# Patient Record
Sex: Male | Born: 1945 | Race: White | Hispanic: No | Marital: Married | Smoking: Former smoker
Health system: Southern US, Community
[De-identification: ages and names within clinical notes are randomized; demographics above are authoritative.]

## PROBLEM LIST (undated history)

## (undated) DIAGNOSIS — I872 Venous insufficiency (chronic) (peripheral): Secondary | ICD-10-CM

## (undated) DIAGNOSIS — M1990 Unspecified osteoarthritis, unspecified site: Secondary | ICD-10-CM

## (undated) DIAGNOSIS — J449 Chronic obstructive pulmonary disease, unspecified: Secondary | ICD-10-CM

## (undated) DIAGNOSIS — M797 Fibromyalgia: Secondary | ICD-10-CM

## (undated) DIAGNOSIS — E119 Type 2 diabetes mellitus without complications: Secondary | ICD-10-CM

## (undated) HISTORY — PX: HEMORROIDECTOMY: SUR656

## (undated) HISTORY — PX: LEG SURGERY: SHX1003

---

## 1997-12-12 ENCOUNTER — Ambulatory Visit (HOSPITAL_BASED_OUTPATIENT_CLINIC_OR_DEPARTMENT_OTHER): Admission: RE | Admit: 1997-12-12 | Discharge: 1997-12-12 | Payer: Self-pay | Admitting: General Surgery

## 2002-09-20 ENCOUNTER — Encounter: Payer: Self-pay | Admitting: Internal Medicine

## 2002-09-20 ENCOUNTER — Encounter: Admission: RE | Admit: 2002-09-20 | Discharge: 2002-09-20 | Payer: Self-pay | Admitting: Internal Medicine

## 2003-03-12 ENCOUNTER — Encounter
Admission: RE | Admit: 2003-03-12 | Discharge: 2003-06-10 | Payer: Self-pay | Admitting: Physical Medicine & Rehabilitation

## 2003-08-14 ENCOUNTER — Encounter
Admission: RE | Admit: 2003-08-14 | Discharge: 2003-11-12 | Payer: Self-pay | Admitting: Physical Medicine & Rehabilitation

## 2004-02-03 ENCOUNTER — Encounter
Admission: RE | Admit: 2004-02-03 | Discharge: 2004-05-03 | Payer: Self-pay | Admitting: Physical Medicine & Rehabilitation

## 2004-02-03 ENCOUNTER — Ambulatory Visit: Payer: Self-pay | Admitting: Physical Medicine & Rehabilitation

## 2010-07-24 NOTE — Group Therapy Note (Signed)
PHYSICIAN REFERRAL:  [name], [facility].   PURPOSE OF EVALUATION:  Evaluate and treat chronic fibromyalgia-type pain.   HISTORY OF PRESENT ILLNESS:  [name] is a 65 year old adult male referred  to this office by [name] for evaluation of chronic fibromyalgia-type  pain.   The patient's medical records indicate that he carries a diagnosis of  fibromyalgia since approximately September of 1997 when it was made by Dr.  Lemmie Evens at that time.  Minimal medical records accompany the  patient but they do indicate that he has been on Vicodin dating back to  approximately January of 1998.  The patient subsequently started Norco  10/325 mg approximately June of 2000.   Subsequent to that, the patient has been treated with OxyContin at 20 mg  q.12 h. since August of 2003.  His dose has recently been adjusted up to  OxyContin 20 mg q.3 h. since approximately June of 2004.   The patient reports that he has seen [name] and his primary care  physician, [name], over the past several months to years for the  fibromyalgia-type pain.  He reports that approximately June of 2004, the  pain localized more into his right ankle region.  At that time, there was no  particular trauma noted.  The patient was sent back to [name] and  reportedly x-rays showed no particular abnormality of his ankle.  He  subsequently has undergone injections at least 2 or 3 times of his ankle and  heel for bone spur and those have helped minimally.  He also reports that  the pain prior to his right ankle was localized throughout his upper back  and upper extremities, more similar to fibromyalgia-type pain.   The patient reports that last Thursday he did follow up with [name], his  primary care physician.  Apparently at that time, [name] was  uncomfortable with the amount of OxyContin that was being prescribed.  At  that time they decided to switch to a  Duragesic patch at 100 mcg per hour,  changed every third day.  He also was given a prescription for Talwin at  that time.   Subsequent to last Thursday, the patient reports that he began experiencing  dystonic reaction and was seen in the emergency room.  At that time, he  discontinued his Talwin but continue his Duragesic patch.  He followed up  with [name] this past Monday, the 3rd of January 2005, and was given a  prescription for OxyContin 20 mg.  The patient reports that he has used all  but 5 of those tablets at the present time.   In terms of his relief, he report that he was getting better relief with the  oxycodone at 15 mg to be used 2 tablets q.4 h., a maximum of 8 tablets per  day.  Dr.  [name] had been treating him more recently with OxyContin and he  had been up to a total of 8 tablets a day; he used approximately every 3  hours, a strength of 20 mg.   The patient presently reports the pain is present over his entire body,  although it is worse sometimes compared to others.  The patient reports that  he has been tried on various over-the-counter medications along with Bextra,  Celebrex, Vioxx, Ultram and Vicodin in the past.  He does feel that the  Duragesic patch may be giving him some extra benefit, although he  is  somewhat interested in continuing the OxyContin at 20 mg q.3 h.  I told him  that I would not be able to write for the frequency that he is requesting  that medication.   PAST MEDICAL HISTORY:  1. 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  2. History of sinus infection.  3. History of hypertension.   MEDICATIONS:  1. Amitriptyline 10 mg nightly.  2. Cardura 8 mg daily.  3. Cimetidine 400 mg 4 times daily.  4. Duragesic patch 100 mcg changed every third day.  5. OxyContin 20 mg 1 tablet q.3 h. p.r.n.  6. Flonase 50 mcg daily.  7. Cyclobenzaprine p.r.n.  8. Acyclovir 400 mg b.i.d.   ALLERGIES:  TALWIN.   FAMILY  HISTORY:  Positive for osteoarthritis and rheumatoid arthritis.   SOCIAL HISTORY:  The patient is married with 1 grown child and 3  stepchildren.  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  He reports minimal alcohol intake and reports that he  smokes approximately 1 pack of cigarettes every 4 days.  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  He reports that  he has been unable to do that more recently with the increased right foot  pain.   PHYSICAL EXAM:  GENERAL:  A well-appearing adult male, height 5 foot 11  inches, weight 200 pounds.  VITAL SIGNS:  Blood pressure 147/70 with a pulse of 88 and O2 saturation of  97% on room air.  NEUROMUSCULAR:  The patient uses no assistive device during ambulation.  He  has regular footwear on the bilateral feet.  Bilateral upper extremity exam  showed painful range of motion throughout.  Lumbar range of motion was  mildly to moderately decreased in all planes, although he was able to obtain  the positions.  Manual muscle testing revealed at least 5-/5 strength  throughout the bilateral upper extremities.  The patient complained of pain  with pressure applied to his musculature of his upper extremities.  Range of  motion was full, tested passively in the seated position, of his bilateral  upper extremities.   Lower extremity exam showed at least 5- to 5/5 strength in hip flexion, knee  extension and ankle dorsiflexion throughout the bilateral lower extremities.  Again, there were complaints of pain with palpation of his hip, thigh and  calf musculature.   Reflexes were 2+ and symmetrical throughout the bilateral upper and lower  extremities and sensation was intact to light touch throughout.   In the prone position, there were no appreciable spasms noted.  Hip range of motion was within normal limits in  the supine position with straight leg  raise negative.   IMPRESSION:  Longstanding history of fibromyalgia, narcotic dependent.   At the present time, we have decided to continue the Duragesic patch at 100  mcg per hour, changed every 72 hours.  I have given him a prescription for  10 in the office today.  We have also decided on OxyContin continuous  release 20 mg 1 tablet q.6 h. p.r.n., a total of 240 were written for in the  office today.  I have told him that I am reluctant to prescribe the  OxyContin that he was using and at the frequency that he was using  previously.  Hopefully, we can obtain better relief with the  long-acting  medicine such as the Duragesic patch.   We will plan on seeing the patient in followup in this office in  approximately 1 month's time and hopefully get him on a regular schedule  that we can decrease the office visits.   I will plan on seeing the patient in followup in approximately 1 month's  time.     Ellwood Dense, M.D.   DC/MedQ  D:  03/14/2003 12:38:47  T:  03/14/2003 13:43:42  Job #:  [id number]   cc:   Windle Guard, M.D.  [address]  [place], [place] [address]  Fax: [phone]

## 2010-07-24 NOTE — Assessment & Plan Note (Signed)
FOLLOW UP:  Mr. Go returns to the clinic today for follow-up evaluation.  He reports that he is getting good relief with his methadone use 10 mg four  tablets p.o. t.i.d.  We recently gave him a refill on January 23, 2004.  He  does report that the methadone gives him minimal relief of new onset of  right ankle and foot pain which has been present for the past seven to eight  weeks.  He reports that he has had a diagnosis of plantar fasciitis in the  past and has had injections by his primary care physician.  He is interested  in possibly going back to see his primary care physician.   The patient reports that he is trying orthotics in his right shoe but they  are giving him minimal benefit at the present time.  The patient reports  that he is not using Flexeril very often at the present time.   MEDICATIONS:  1.  Flonase p.r.n.  2.  Acyclovir 400 mg q.12h.  3.  Cardura 8 mg q.d.  4.  Cimetidine 300 mg q.i.d.  5.  Methadone 10 mg 4 tablets q.i.d.  6.  Cyclobenzaprine 10 mg 1/2 tablet q.h.s. p.r.n. (rarely use).   PHYSICAL EXAMINATION:  GENERAL:  A well-appearing, fit adult male in no  acute distress.  VITAL SIGNS:  Blood pressure 126/61 with a pulse of 70, respiratory rate 20.  O2 saturation 95% on room air.  MUSCULOSKELETAL:  He has 5/5 strength throughout the bilateral upper and  lower extremities.  Bulk and tone were normal.  Reflexes were 2+ and  symmetrical.  The patient ambulates without any assisted device.  The  patient has complaints of pain with palpation of musculature of his thighs,  lower arms, and especially upper arms and back.   IMPRESSION:  Fibromyalgia, narcotic dependent.   PLAN:  In the clinic today, no refill of medication is necessary.  I have  asked him to follow up with his primary care physician to see about  a  possible treatment of the assumed plantar fascitis.  He has been trying  orthotics without much relief at the present time.  We will plan on  seeing  the patient in follow up in this office in approximately three to four  months time with refills prior to that appointment if necessary.  He  continues to be very compliant with his methadone at the present time.       DC/MedQ  D:  02/03/2004 13:03:05  T:  02/03/2004 14:19:13  Job #:  [id number]

## 2015-01-20 ENCOUNTER — Telehealth (HOSPITAL_COMMUNITY): Payer: Self-pay

## 2015-01-20 NOTE — Telephone Encounter (Signed)
I have called an verified pulmonary rehab orientation appointment with patient. Instructions given. Patient verbalized understanding. Homework to be mailed.

## 2015-02-03 ENCOUNTER — Ambulatory Visit (HOSPITAL_COMMUNITY): Payer: Self-pay

## 2015-02-07 ENCOUNTER — Encounter (HOSPITAL_COMMUNITY)
Admission: RE | Admit: 2015-02-07 | Discharge: 2015-02-07 | Disposition: A | Discharge status: Home / Self Care | Payer: Medicare Other | Source: Ambulatory Visit | Attending: Internal Medicine | Admitting: Internal Medicine

## 2015-02-07 ENCOUNTER — Encounter (HOSPITAL_COMMUNITY): Payer: Self-pay

## 2015-02-07 VITALS — BP 159/79 | HR 104 | Resp 18 | Ht 67.75 in | Wt 242.3 lb

## 2015-02-07 DIAGNOSIS — J439 Emphysema, unspecified: Secondary | ICD-10-CM | POA: Insufficient documentation

## 2015-02-07 DIAGNOSIS — J441 Chronic obstructive pulmonary disease with (acute) exacerbation: Secondary | ICD-10-CM

## 2015-02-07 HISTORY — DX: Chronic obstructive pulmonary disease, unspecified: J44.9

## 2015-02-07 HISTORY — DX: Venous insufficiency (chronic) (peripheral): I87.2

## 2015-02-07 HISTORY — DX: Fibromyalgia: M79.7

## 2015-02-07 HISTORY — DX: Type 2 diabetes mellitus without complications: E11.9

## 2015-02-07 HISTORY — DX: Unspecified osteoarthritis, unspecified site: M19.90

## 2015-02-07 NOTE — Progress Notes (Signed)
Mark Wood 69 y.o. male Pulmonary Rehab Orientation Note Patient arrived today in Cardiac and Pulmonary Rehab for orientation to Pulmonary Rehab. He ambulated from [employer] accompanied by his wife. He does not carry portable oxygen, although he has been prescribed oxygen at home for intermittent use at 2 liters per minute. He wears this at night and for chest tightness and shortness of breath during the day. He also states if he does not fees short of breath but his oxygen saturations are less than 88, he still does not utilize his oxygen. Re-educated the patient on the importance of maintaining oxygen saturations =/> 88%. Color good, skin warm and dry. Lower extremities have a dark ruddy color below the knees bilat. Patient states he has venous stasis. Patient is oriented to time and place. Patient's medical history, psychosocial health, and medications reviewed. Psychosocial assessment reveals pt lives with their spouse. Pt is currently retired. 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 Pt hobbies include woodworking and caring for his two horses. Unfortunately he is unable to enjoy either activity to its fullest because of his shortness of breath. He states he is a "couch potato" but really wants to regain living his life to its fullest if he can. His wife still works as an Pensions consultantattorney part time. Pt reports his stress level is moderate. Areas of stress/anxiety include Health.  Pt does exhibits some signs of depression. Signs of depression include feeling uncertain about his future and fatigue. PHQ2/9 score 4/14. Pt shows good  coping skills with questionable outlook . He was offered emotional support and reassurance. He states he has an [facility] and feels comfortable discussing his PHQ scores with him. Will continue to monitor and evaluate progress toward psychosocial goal(s) of improving coping skills and managing  signs and symptoms of depression over his diagnosis and shortness of breath. Physical assessment reveals heart rate is tachycardic, S1S2 present. Breath sounds clear to auscultation, no wheezes, rales, or rhonchi. Grip strength equal, strong. Distal pulses strong in the upper extremities and faint in his lower. Patient reports he does take medications as prescribed. Patient states he follows a Diabetic diet. The patient reports no specific efforts to gain or lose weight. He has gained weight since being placed on steroids. Patient's weight will be monitored closely. Demonstration and practice of PLB using pulse oximeter. Patient able to return demonstration satisfactorily. Safety and hand hygiene in the exercise area reviewed with patient. Patient voices understanding of the information reviewed. Department expectations discussed with patient and achievable goals were set. The patient shows enthusiasm about attending the program and we look forward to working with this nice gentleman. The patient is scheduled for a 6 min walk test on Tuesday 12/6 at 3:30 and to begin exercise on Thursday 12/8 in the 1:30 class.   45 minutes was spent on a variety of activities such as assessment of the patient, obtaining baseline data including height, weight, BMI, and grip strength, verifying medical history, allergies, and current medications, and teaching patient strategies for performing tasks with less respiratory effort with emphasis on pursed lip breathing.

## 2015-02-10 ENCOUNTER — Encounter (HOSPITAL_COMMUNITY): Payer: Self-pay

## 2015-02-10 DIAGNOSIS — M1990 Unspecified osteoarthritis, unspecified site: Secondary | ICD-10-CM | POA: Insufficient documentation

## 2015-02-11 ENCOUNTER — Encounter (HOSPITAL_COMMUNITY)
Admission: RE | Admit: 2015-02-11 | Discharge: 2015-02-11 | Disposition: A | Discharge status: Home / Self Care | Payer: Medicare Other | Source: Ambulatory Visit | Attending: Internal Medicine | Admitting: Internal Medicine

## 2015-02-11 NOTE — Progress Notes (Signed)
Mark Wood completed a Six-Minute Walk Test on 02/11/15 . Mark Wood walked 907 feet with 0 breaks.  The patient's lowest oxygen saturation was 91 %, highest heart rate was 118 bpm , and highest blood pressure was 140/64. The patient was on room air. Patient stated that stiffness hindered their walk test.

## 2015-02-13 ENCOUNTER — Encounter (HOSPITAL_COMMUNITY)
Admission: RE | Admit: 2015-02-13 | Discharge: 2015-02-13 | Disposition: A | Discharge status: Home / Self Care | Payer: Medicare Other | Source: Ambulatory Visit | Attending: Internal Medicine | Admitting: Internal Medicine

## 2015-02-13 DIAGNOSIS — J439 Emphysema, unspecified: Secondary | ICD-10-CM | POA: Diagnosis not present

## 2015-02-13 LAB — GLUCOSE, CAPILLARY: GLUCOSE-CAPILLARY: 534 mg/dL — AB (ref 65–99)

## 2015-02-13 NOTE — Progress Notes (Signed)
Patient came to Pulmonary Rehab to exercise for the first time.  After checking his CBG results were greater than 400 on his home glucose meter.  I checked it on the department glucometer and result was 534.  After quizzing patient he has stopped taking his Metformin for the last 2 days due to severe stomach pain, indigestion, and diarrhea after taking the medication.  Since he was coming to exercise here he did not want to experience these symptoms.  Patient states he just has not felt well today, no energy.  No other symptoms.  I called his primary care physician, Dr. Jeannetta NapElkins and he ordered the patient to take another dose of Amaryl 2mg  and another dose of Actos 15 mg when he gets home.  He is to recheck his blood sugar tonight and in the morning.  I instructed him to go to the ED if his blood sugar continues to go up and/or if he becomes incoherent or any other abnormal symptoms.  He wife was present with all these instructions.  I also went over his Quality of Life survey he filled out in Pulmonary Rehab.  Scores 21.0 or below are considered low. Pt scored very low in several areas Overall 9.23, Health and Function 7.47, physiological and spiritual 9.79, family 7.50, and socioeconomic.  I discussed this with patient alone and he is very depressed with his current health issues and cannot do what he used to do.  He has an appointment tomorrow with Dr. Jeannetta NapElkins to address the Metformin issue tomorrow and he will discuss his depression with him as well.  He has been on Prozac in the past, but He is hopeful pulmonary rehab can improve his QOL.  Will keep a close eye on his depression.  He has no thoughts of harming himself or others.

## 2015-02-18 ENCOUNTER — Encounter (HOSPITAL_COMMUNITY): Payer: Medicare Other

## 2015-02-20 ENCOUNTER — Encounter (HOSPITAL_COMMUNITY): Payer: Medicare Other

## 2015-02-25 ENCOUNTER — Encounter (HOSPITAL_COMMUNITY): Payer: Medicare Other

## 2015-02-27 ENCOUNTER — Telehealth (HOSPITAL_COMMUNITY): Payer: Self-pay | Admitting: *Deleted

## 2015-02-27 ENCOUNTER — Encounter (HOSPITAL_COMMUNITY): Admission: RE | Admit: 2015-02-27 | Payer: Medicare Other | Source: Ambulatory Visit

## 2015-03-04 ENCOUNTER — Encounter (HOSPITAL_COMMUNITY): Payer: Medicare Other

## 2015-03-06 ENCOUNTER — Encounter (HOSPITAL_COMMUNITY): Payer: Medicare Other

## 2015-03-11 ENCOUNTER — Encounter (HOSPITAL_COMMUNITY): Payer: Self-pay

## 2015-03-13 ENCOUNTER — Encounter (HOSPITAL_COMMUNITY): Payer: Self-pay

## 2015-03-18 ENCOUNTER — Encounter (HOSPITAL_COMMUNITY): Payer: Self-pay

## 2015-03-20 ENCOUNTER — Encounter (HOSPITAL_COMMUNITY): Admission: RE | Admit: 2015-03-20 | Payer: Self-pay | Source: Ambulatory Visit

## 2015-03-25 ENCOUNTER — Encounter (HOSPITAL_COMMUNITY): Payer: Self-pay

## 2015-03-27 ENCOUNTER — Encounter (HOSPITAL_COMMUNITY): Payer: Self-pay

## 2015-04-01 ENCOUNTER — Encounter (HOSPITAL_COMMUNITY): Payer: Self-pay

## 2015-04-01 ENCOUNTER — Telehealth (HOSPITAL_COMMUNITY): Payer: Self-pay | Admitting: *Deleted

## 2015-04-03 ENCOUNTER — Encounter (HOSPITAL_COMMUNITY)
Admission: RE | Admit: 2015-04-03 | Discharge: 2015-04-03 | Disposition: A | Discharge status: Home / Self Care | Payer: Self-pay | Source: Ambulatory Visit | Attending: Internal Medicine | Admitting: Internal Medicine

## 2015-04-03 DIAGNOSIS — J439 Emphysema, unspecified: Secondary | ICD-10-CM | POA: Insufficient documentation

## 2015-04-03 NOTE — Progress Notes (Signed)
Mark Wood has been discharged from pulmonary rehab due to poor attendance.  When he initially started his exercise sessions his capillary blood glucose was greater than 300 and was sent to [name].  He had stopped taking his metformin due to gastric distress and [name] tweaked his meds.  He came another time and again his CBG was greater than 300 and was sent home.  Then over Christmas he stepped on a toy and twisted his leg.  That issue has resolved and he still has not returned.  Last week he told me he would be present at the 04/01/2015 session and did not show.  I called him at the end of that day to discharge him from the program.  We require a 90% attendance to stay in the program.

## 2015-04-08 ENCOUNTER — Encounter (HOSPITAL_COMMUNITY): Payer: Self-pay

## 2015-04-10 ENCOUNTER — Encounter (HOSPITAL_COMMUNITY): Payer: Self-pay

## 2015-04-15 ENCOUNTER — Encounter (HOSPITAL_COMMUNITY): Payer: Self-pay

## 2015-04-17 ENCOUNTER — Encounter (HOSPITAL_COMMUNITY): Payer: Self-pay

## 2015-04-22 ENCOUNTER — Encounter (HOSPITAL_COMMUNITY): Payer: Self-pay

## 2015-04-24 ENCOUNTER — Encounter (HOSPITAL_COMMUNITY): Payer: Self-pay

## 2015-04-29 ENCOUNTER — Encounter (HOSPITAL_COMMUNITY): Payer: Self-pay

## 2015-05-01 ENCOUNTER — Encounter (HOSPITAL_COMMUNITY): Payer: Self-pay

## 2015-05-06 ENCOUNTER — Encounter (HOSPITAL_COMMUNITY): Payer: Self-pay

## 2015-05-08 ENCOUNTER — Encounter (HOSPITAL_COMMUNITY): Payer: Self-pay

## 2015-05-13 ENCOUNTER — Encounter (HOSPITAL_COMMUNITY): Payer: Self-pay

## 2015-05-15 ENCOUNTER — Encounter (HOSPITAL_COMMUNITY): Payer: Self-pay

## 2015-05-20 ENCOUNTER — Encounter (HOSPITAL_COMMUNITY): Payer: Self-pay

## 2015-05-22 ENCOUNTER — Encounter (HOSPITAL_COMMUNITY): Payer: Self-pay

## 2015-05-27 ENCOUNTER — Encounter (HOSPITAL_COMMUNITY): Payer: Self-pay

## 2015-05-29 ENCOUNTER — Encounter (HOSPITAL_COMMUNITY): Payer: Self-pay

## 2015-06-03 ENCOUNTER — Encounter (HOSPITAL_COMMUNITY): Payer: Self-pay

## 2015-06-05 ENCOUNTER — Encounter (HOSPITAL_COMMUNITY): Payer: Self-pay

## 2015-06-10 ENCOUNTER — Encounter (HOSPITAL_COMMUNITY): Payer: Self-pay

## 2015-06-12 ENCOUNTER — Encounter (HOSPITAL_COMMUNITY): Payer: Self-pay

## 2015-06-17 ENCOUNTER — Encounter (HOSPITAL_COMMUNITY): Payer: Self-pay

## 2018-04-27 ENCOUNTER — Other Ambulatory Visit: Payer: Self-pay | Admitting: Family Medicine

## 2018-04-27 DIAGNOSIS — Z8639 Personal history of other endocrine, nutritional and metabolic disease: Secondary | ICD-10-CM

## 2018-05-17 ENCOUNTER — Other Ambulatory Visit: Payer: Medicare Other

## 2018-05-24 ENCOUNTER — Other Ambulatory Visit: Payer: Medicare Other

## 2018-05-31 ENCOUNTER — Other Ambulatory Visit: Payer: Medicare Other

## 2018-06-05 ENCOUNTER — Other Ambulatory Visit: Payer: Medicare Other

## 2018-07-03 ENCOUNTER — Inpatient Hospital Stay: Admission: RE | Admit: 2018-07-03 | Payer: Medicare Other | Source: Ambulatory Visit

## 2018-07-14 ENCOUNTER — Emergency Department (HOSPITAL_COMMUNITY): Payer: Medicare Other

## 2018-07-14 ENCOUNTER — Encounter (HOSPITAL_COMMUNITY): Payer: Self-pay | Admitting: Emergency Medicine

## 2018-07-14 ENCOUNTER — Other Ambulatory Visit: Payer: Self-pay

## 2018-07-14 ENCOUNTER — Emergency Department (HOSPITAL_COMMUNITY)
Admission: EM | Admit: 2018-07-14 | Discharge: 2018-07-14 | Disposition: A | Discharge status: Home / Self Care | Payer: Medicare Other | Attending: Emergency Medicine | Admitting: Emergency Medicine

## 2018-07-14 DIAGNOSIS — R5383 Other fatigue: Secondary | ICD-10-CM | POA: Insufficient documentation

## 2018-07-14 DIAGNOSIS — J449 Chronic obstructive pulmonary disease, unspecified: Secondary | ICD-10-CM | POA: Diagnosis not present

## 2018-07-14 DIAGNOSIS — I4891 Unspecified atrial fibrillation: Secondary | ICD-10-CM | POA: Diagnosis not present

## 2018-07-14 DIAGNOSIS — E119 Type 2 diabetes mellitus without complications: Secondary | ICD-10-CM | POA: Insufficient documentation

## 2018-07-14 DIAGNOSIS — Z87891 Personal history of nicotine dependence: Secondary | ICD-10-CM | POA: Insufficient documentation

## 2018-07-14 LAB — COMPREHENSIVE METABOLIC PANEL
ALT: 20 U/L (ref 0–44)
AST: 25 U/L (ref 15–41)
Albumin: 3.7 g/dL (ref 3.5–5.0)
Alkaline Phosphatase: 58 U/L (ref 38–126)
Anion gap: 10 (ref 5–15)
BUN: 9 mg/dL (ref 8–23)
CO2: 29 mmol/L (ref 22–32)
Calcium: 9 mg/dL (ref 8.9–10.3)
Chloride: 99 mmol/L (ref 98–111)
Creatinine, Ser: 0.77 mg/dL (ref 0.61–1.24)
GFR calc Af Amer: >60 mL/min (ref >60)
GFR calc non Af Amer: >60 mL/min (ref >60)
Glucose, Bld: 127 mg/dL — ABNORMAL HIGH (ref 70–99)
Potassium: 4 mmol/L (ref 3.5–5.1)
Sodium: 138 mmol/L (ref 135–145)
Total Bilirubin: 0.8 mg/dL (ref 0.3–1.2)
Total Protein: 6.9 g/dL (ref 6.5–8.1)

## 2018-07-14 LAB — CBC
HCT: 46.3 % (ref 39.0–52.0)
Hemoglobin: 14.5 g/dL (ref 13.0–17.0)
MCH: 27.8 pg (ref 26.0–34.0)
MCHC: 31.3 g/dL (ref 30.0–36.0)
MCV: 88.9 fL (ref 80.0–100.0)
Platelets: 151 10*3/uL (ref 150–400)
RBC: 5.21 MIL/uL (ref 4.22–5.81)
RDW: 12.4 % (ref 11.5–15.5)
WBC: 5.8 10*3/uL (ref 4.0–10.5)
nRBC: 0 % (ref 0.0–0.2)

## 2018-07-14 LAB — MAGNESIUM: Magnesium: 1.9 mg/dL (ref 1.7–2.4)

## 2018-07-14 LAB — TSH: TSH: 2.987 u[IU]/mL (ref 0.350–4.500)

## 2018-07-14 MED ORDER — APIXABAN 5 MG PO TABS: 5.0000 mg | ORAL_TABLET | Freq: Two times a day (BID) | ORAL | 1 refills | Status: DC

## 2018-07-14 MED ORDER — APIXABAN 5 MG PO TABS
5.0000 mg | ORAL_TABLET | Freq: Once | ORAL | Status: AC
  Administered 2018-07-14: 14:00:00 5 mg via ORAL
  Filled 2018-07-14: qty 1

## 2018-07-14 NOTE — ED Notes (Signed)
Please call Jaysen Venner (wife) at [phone] with update

## 2018-07-14 NOTE — Discharge Instructions (Addendum)
You were evaluated in the Emergency Department and after careful evaluation, we did not find any emergent condition requiring admission or further testing in the hospital.  Your heart is in an abnormal rhythm called atrial fibrillation.  Your testing today was otherwise normal.  Please call the A. fib clinic as soon as you can to schedule a follow-up appointment.  Please take the apixaban medication as directed.  Please return to the Emergency Department if you experience any worsening of your condition.  We encourage you to follow up with a primary care provider.  Thank you for allowing [place] to be a part of your care.   Information on my medicine - ELIQUIS (apixaban)  This medication education was reviewed with me or my healthcare representative as part of my discharge preparation.  The pharmacist spoke with me during my hospital stay. Why was Eliquis prescribed for you? Eliquis was prescribed for you to reduce the risk of forming blood clots that can cause a stroke if you have a medical condition called atrial fibrillation (a type of irregular heartbeat) OR to reduce the risk of a blood clots forming after orthopedic surgery.  What do You need to know about Eliquis ? Take your Eliquis TWICE DAILY - one tablet in the morning and one tablet in the evening with or without food.  It would be best to take the doses about the same time each day.  If you have difficulty swallowing the tablet whole please discuss with your pharmacist how to take the medication safely.  Take Eliquis exactly as prescribed by your doctor and DO NOT stop taking Eliquis without talking to the doctor who prescribed the medication.  Stopping may increase your risk of developing a new clot or stroke.  Refill your prescription before you run out.  After discharge, you should have regular check-up appointments with your healthcare provider that is prescribing your Eliquis.  In the future your dose may need to be changed if  your kidney function or weight changes by a significant amount or as you get older.  What do you do if you miss a dose? If you miss a dose, take it as soon as you remember on the same day and resume taking twice daily.  Do not take more than one dose of ELIQUIS at the same time.  Important Safety Information A possible side effect of Eliquis is bleeding. You should call your healthcare provider right away if you experience any of the following: ? Bleeding from an injury or your nose that does not stop. ? Unusual colored urine (red or dark brown) or unusual colored stools (red or black). ? Unusual bruising for unknown reasons. ? A serious fall or if you hit your head (even if there is no bleeding).  Some medicines may interact with Eliquis and might increase your risk of bleeding or clotting while on Eliquis. To help avoid this, consult your healthcare provider or pharmacist prior to using any new prescription or non-prescription medications, including herbals, vitamins, non-steroidal anti-inflammatory drugs (NSAIDs) and supplements.  This website has more information on Eliquis (apixaban): [email or web address].com.cy.

## 2018-07-14 NOTE — ED Provider Notes (Addendum)
[facility] Emergency Department Provider Note MRN:  [id number]  Arrival date & time: 07/14/18     Chief Complaint   Atrial fibrillation History of Present Illness   Mark Wood is a 73 y.o. year-old male with a history of COPD, diabetes presenting to the ED with chief complaint of atrial fibrillation.  Patient was sent here from primary care doctor.  Was there for routine visit, was found to have an irregular heartbeat, thought to be in atrial fibrillation.  Patient denies any chest pain or shortness of breath.  Has noted some decreased energy for the past 2 months, denies headache or vision change, no fever, no cough, no abdominal pain, no dysuria, no numbness or weakness to the arms or legs.  Review of Systems  A complete 10 system review of systems was obtained and all systems are negative except as noted in the HPI and PMH.   Patient's Health History    Past Medical History:  Diagnosis Date  . Arthritis   . COPD (chronic obstructive pulmonary disease) (HCC)   . Diabetes mellitus without complication (HCC)   . Fibromyalgia   . Venous insufficiency     History reviewed. No pertinent surgical history.  No family history on file.  Social History   Socioeconomic History  . Marital status: Single    Spouse name: Not on file  . Number of children: Not on file  . Years of education: Not on file  . Highest education level: Not on file  Occupational History  . Not on file  Social Needs  . Financial resource strain: Not on file  . Food insecurity:    Worry: Not on file    Inability: Not on file  . Transportation needs:    Medical: Not on file    Non-medical: Not on file  Tobacco Use  . Smoking status: Former Smoker    Last attempt to quit: 10/07/2006    Years since quitting: 11.7  Substance and Sexual Activity  . Alcohol use: No    Alcohol/week: 0.0 standard drinks  . Drug use: Not on file  . Sexual activity: Not on file  Lifestyle  . Physical  activity:    Days per week: Not on file    Minutes per session: Not on file  . Stress: Not on file  Relationships  . Social connections:    Talks on phone: Not on file    Gets together: Not on file    Attends religious service: Not on file    Active member of club or organization: Not on file    Attends meetings of clubs or organizations: Not on file    Relationship status: Not on file  . Intimate partner violence:    Fear of current or ex partner: Not on file    Emotionally abused: Not on file    Physically abused: Not on file    Forced sexual activity: Not on file  Other Topics Concern  . Not on file  Social History Narrative  . Not on file     Physical Exam  Vital Signs and Nursing Notes reviewed Vitals:   07/14/18 1148 07/14/18 1200  BP:  126/64  Pulse: 89 81  Resp: 19 (!) 21  Temp:    SpO2: 95% 94%    CONSTITUTIONAL: Chronically ill-appearing, NAD NEURO:  Alert and oriented x 3, no focal deficits EYES:  eyes equal and reactive ENT/NECK:  no LAD, no JVD CARDIO: Regular rate, well-perfused, normal  S1 and S2; irregular rhythm PULM:  CTAB no wheezing or rhonchi GI/GU:  normal bowel sounds, non-distended, non-tender MSK/SPINE:  No gross deformities, no edema SKIN:  no rash, atraumatic PSYCH:  Appropriate speech and behavior  Diagnostic and Interventional Summary    EKG Interpretation  Date/Time:  Friday Jul 14 2018 11:17:33 EDT Ventricular Rate:  79 PR Interval:    QRS Duration: 97 QT Interval:  357 QTC Calculation: 442 R Axis:   -65 Text Interpretation:  Atrial fibrillation Left anterior fascicular block Consider anterior infarct Minimal ST elevation, lateral leads no prior for comparison Confirmed by Kennis Carina [phone]) on 07/14/2018 11:25:01 AM      Labs Reviewed  COMPREHENSIVE METABOLIC PANEL - Abnormal; Notable for the following components:      Result Value   Glucose, Bld 127 (*)    All other components within normal limits  CBC  TSH  MAGNESIUM     DG Chest Port 1 View  Final Result      Medications  apixaban (ELIQUIS) tablet 5 mg (has no administration in time range)     Procedures Critical Care  ED Course and Medical Decision Making  I have reviewed the triage vital signs and the nursing notes.  Pertinent labs & imaging results that were available during my care of the patient were reviewed by me and considered in my medical decision making (see below for details).  Largely asymptomatic new onset A. fib in this 73 year old male with history of diabetes.  Normal rate in the 70s here in the emergency department.  Favoring primary cardiac etiology of this A. fib, will obtain screening labs and chest x-ray to exclude obvious secondary causes.  Patient is a good candidate for anticoagulation and follow-up in the A. fib clinic.  This patients CHA2DS2-VASc Score and unadjusted Ischemic Stroke Rate (% per year) is equal to 2.2 % stroke rate/year from a score of 2  Above score calculated as 1 point each if present [CHF, HTN, DM, Vascular=MI/PAD/Aortic Plaque, Age if 65-74, or Male] Above score calculated as 2 points each if present [Age > 75, or Stroke/TIA/TE]  Labs are all within normal limits, patient continues to have a normal rate but persistent A. fib here in the emergency department.  We will follow-up in the A. fib clinic, educated on Eliquis by pharmacy, will facilitate meds to beds.  After the discussed management above, the patient was determined to be safe for discharge.  The patient was in agreement with this plan and all questions regarding their care were answered.  ED return precautions were discussed and the patient will return to the ED with any significant worsening of condition.  2:44 PM Update: It appears that patient left prior to his new Eliquis medications arriving via the meds to beds service with pharmacy.  I have attempted calling the patient's wife 3 times, no answer, voicemail is full and I am unable to  leave her a message.  Patient is well aware of the importance of anticoagulation as he was educated on this by both myself and the pharmacist.  He was also instructed to call the A. fib clinic when he got home today.   Elmer Sow. Pilar Plate, MD [facility] Emergency Medicine [facility] [email or web address]  Final Clinical Impressions(s) / ED Diagnoses     ICD-10-CM   1. Atrial fibrillation, unspecified type (HCC) I48.91   2. Decreased energy R53.83 DG Chest [facility] 1 View    DG Chest [facility] 1 [address]  ED Discharge Orders         Ordered    Amb referral to AFIB Clinic     07/14/18 1149    apixaban (ELIQUIS) 5 MG TABS tablet  2 times daily     07/14/18 1259             Sabas SousBero, Larina Lieurance M, MD 07/14/18 1306    Sabas SousBero, Mallie Giambra M, MD 07/14/18 1446

## 2018-07-14 NOTE — ED Triage Notes (Signed)
[facility]- pt from doctors office. PCP noticed pt had new onset afib. Pt not complaining of any CP or SOB.

## 2018-07-14 NOTE — ED Notes (Signed)
Patient verbalizes understanding of discharge instructions. Opportunity for questioning and answers were provided. Armband removed by staff, pt discharged from ED ambulatory to home.  

## 2018-07-17 ENCOUNTER — Telehealth (HOSPITAL_COMMUNITY): Payer: Self-pay | Admitting: *Deleted

## 2018-07-17 NOTE — Telephone Encounter (Signed)
I cld pt to offer appt since referred from ED to the afib clinic.  Pt declined appt.  [facility] [place] [facility] would be [facility] for this as well.

## 2018-07-20 ENCOUNTER — Encounter (HOSPITAL_COMMUNITY): Payer: Self-pay | Admitting: Emergency Medicine

## 2018-07-20 ENCOUNTER — Emergency Department (HOSPITAL_COMMUNITY): Payer: Medicare Other

## 2018-07-20 ENCOUNTER — Emergency Department (HOSPITAL_COMMUNITY)
Admission: EM | Admit: 2018-07-20 | Discharge: 2018-07-20 | Disposition: A | Discharge status: Home / Self Care | Payer: Medicare Other | Attending: Emergency Medicine | Admitting: Emergency Medicine

## 2018-07-20 ENCOUNTER — Other Ambulatory Visit: Payer: Self-pay

## 2018-07-20 DIAGNOSIS — J449 Chronic obstructive pulmonary disease, unspecified: Secondary | ICD-10-CM | POA: Insufficient documentation

## 2018-07-20 DIAGNOSIS — Z87891 Personal history of nicotine dependence: Secondary | ICD-10-CM | POA: Diagnosis not present

## 2018-07-20 DIAGNOSIS — Z794 Long term (current) use of insulin: Secondary | ICD-10-CM | POA: Diagnosis not present

## 2018-07-20 DIAGNOSIS — Z7901 Long term (current) use of anticoagulants: Secondary | ICD-10-CM | POA: Insufficient documentation

## 2018-07-20 DIAGNOSIS — R002 Palpitations: Secondary | ICD-10-CM

## 2018-07-20 DIAGNOSIS — Z79899 Other long term (current) drug therapy: Secondary | ICD-10-CM | POA: Diagnosis not present

## 2018-07-20 DIAGNOSIS — R0602 Shortness of breath: Secondary | ICD-10-CM | POA: Diagnosis not present

## 2018-07-20 DIAGNOSIS — E119 Type 2 diabetes mellitus without complications: Secondary | ICD-10-CM | POA: Diagnosis not present

## 2018-07-20 LAB — BASIC METABOLIC PANEL
Anion gap: 10 (ref 5–15)
BUN: 12 mg/dL (ref 8–23)
CO2: 27 mmol/L (ref 22–32)
Calcium: 9.5 mg/dL (ref 8.9–10.3)
Chloride: 99 mmol/L (ref 98–111)
Creatinine, Ser: 0.79 mg/dL (ref 0.61–1.24)
GFR calc Af Amer: >60 mL/min (ref >60)
GFR calc non Af Amer: >60 mL/min (ref >60)
Glucose, Bld: 162 mg/dL — ABNORMAL HIGH (ref 70–99)
Potassium: 3.9 mmol/L (ref 3.5–5.1)
Sodium: 136 mmol/L (ref 135–145)

## 2018-07-20 LAB — CBC WITH DIFFERENTIAL/PLATELET
Abs Immature Granulocytes: 0.02 10*3/uL (ref 0.00–0.07)
Basophils Absolute: 0 10*3/uL (ref 0.0–0.1)
Basophils Relative: 0 %
Eosinophils Absolute: 0.1 10*3/uL (ref 0.0–0.5)
Eosinophils Relative: 1 %
HCT: 46.5 % (ref 39.0–52.0)
Hemoglobin: 14.6 g/dL (ref 13.0–17.0)
Immature Granulocytes: 0 %
Lymphocytes Relative: 12 %
Lymphs Abs: 0.8 10*3/uL (ref 0.7–4.0)
MCH: 27.6 pg (ref 26.0–34.0)
MCHC: 31.4 g/dL (ref 30.0–36.0)
MCV: 87.9 fL (ref 80.0–100.0)
Monocytes Absolute: 0.5 10*3/uL (ref 0.1–1.0)
Monocytes Relative: 7 %
Neutro Abs: 5.2 10*3/uL (ref 1.7–7.7)
Neutrophils Relative %: 80 %
Platelets: 161 10*3/uL (ref 150–400)
RBC: 5.29 MIL/uL (ref 4.22–5.81)
RDW: 12.5 % (ref 11.5–15.5)
WBC: 6.6 10*3/uL (ref 4.0–10.5)
nRBC: 0 % (ref 0.0–0.2)

## 2018-07-20 LAB — TROPONIN I: Troponin I: <0.03 ng/mL (ref <0.03)

## 2018-07-20 LAB — D-DIMER, QUANTITATIVE: D-Dimer, Quant: <0.27 ug/mL-FEU (ref 0.00–0.50)

## 2018-07-20 MED ORDER — HYDROXYZINE HCL 10 MG PO TABS
10.0000 mg | ORAL_TABLET | Freq: Once | ORAL | Status: AC
  Administered 2018-07-20: 16:00:00 10 mg via ORAL
  Filled 2018-07-20: qty 1

## 2018-07-20 MED ORDER — ALBUTEROL SULFATE HFA 108 (90 BASE) MCG/ACT IN AERS
2.0000 | INHALATION_SPRAY | Freq: Once | RESPIRATORY_TRACT | Status: AC
  Administered 2018-07-20: 16:00:00 2 via RESPIRATORY_TRACT
  Filled 2018-07-20: qty 6.7

## 2018-07-20 NOTE — ED Triage Notes (Signed)
Pt was seen here Friday for new onset of A-fib, reports feeling same symptoms today. Denies CP or SOB.

## 2018-07-20 NOTE — ED Notes (Signed)
This RN acting as Statistician and asked pt if he would like me to call friend/family for him. Pt declined and states he can keep family informed.

## 2018-07-20 NOTE — ED Notes (Signed)
Patient verbalizes understanding of discharge instructions. Opportunity for questioning and answers were provided. Armband removed by staff, pt discharged from ED. Follow up with cardiology reviewed. Pt wheeled to lobby and confirmed ride is here waiting for him.

## 2018-07-20 NOTE — ED Provider Notes (Signed)
[facility] Provider Note   CSN: [id number] Arrival date & time: 07/20/18  1509    History   Chief Complaint Chief Complaint  Patient presents with  . Shortness of Breath    HPI Mark Wood is a 73 y.o. male.     The history is provided by the patient and medical records. No language interpreter was used.  Palpitations   Mark Wood is a 73 y.o. male who presents to the Emergency Department complaining of palpitations. He presents to the emergency department complaining of palpitations/atrial fibrillation. He was seen last Friday for similar episode and was diagnosed with a fib. He states that he is feeling hot, clammy and like he can't catch his breath. He denies any chest pain. He does have some mild shortness of breath. He has a history of COPD, Agent Orange exposure, fibromyalgia, diabetes. He lives at home with his wife. He has no known sick contacts. He states his symptoms had completely resolved after his ED visit on Friday. He was feeling well until just after lunch at 1 PM today when he had recurrent symptoms. Past Medical History:  Diagnosis Date  . Arthritis   . COPD (chronic obstructive pulmonary disease) (HCC)   . Diabetes mellitus without complication (HCC)   . Fibromyalgia   . Venous insufficiency     Patient Active Problem List   Diagnosis Date Noted  . Arthritis     History reviewed. No pertinent surgical history.      Home Medications    Prior to Admission medications   Medication Sig Start Date End Date Taking? Authorizing Provider  acyclovir (ZOVIRAX) 400 MG tablet Take 400 mg by mouth daily.    [provider]  apixaban (ELIQUIS) 5 MG TABS tablet Take 1 tablet (5 mg total) by mouth 2 (two) times daily for 30 days. 07/14/18 08/13/18  Sabas SousBero, Michael M, MD  furosemide (LASIX) 80 MG tablet Take 80 mg by mouth daily.    [provider]  insulin glargine (LANTUS) 100 UNIT/ML injection Inject 17  Units into the skin daily.    [provider]  methadone (DOLOPHINE) 10 MG tablet Take 20 mg by mouth every 8 (eight) hours as needed for moderate pain.     [provider]  tamsulosin (FLOMAX) 0.4 MG CAPS capsule Take 0.4 mg by mouth daily after supper.    [provider]    Family History History reviewed. No pertinent family history.  Social History Social History   Tobacco Use  . Smoking status: Former Smoker    Last attempt to quit: 10/07/2006    Years since quitting: 11.7  Substance Use Topics  . Alcohol use: No    Alcohol/week: 0.0 standard drinks  . Drug use: Not on file     Allergies   Patient has no known allergies.   Review of Systems Review of Systems  Cardiovascular: Positive for palpitations.  All other systems reviewed and are negative.    Physical Exam Updated Vital Signs BP (!) 182/78   Pulse 62   Temp 97.8 F (36.6 C) (Oral)   Resp 18   SpO2 97%   Physical Exam Vitals signs and nursing note reviewed.  Constitutional:      Appearance: He is well-developed.  HENT:     Head: Normocephalic and atraumatic.  Cardiovascular:     Rate and Rhythm: Normal rate and regular rhythm.     Heart sounds: No murmur.  Pulmonary:  Effort: Pulmonary effort is normal. No respiratory distress.     Breath sounds: Normal breath sounds.  Abdominal:     Palpations: Abdomen is soft.     Tenderness: There is no abdominal tenderness. There is no guarding or rebound.  Musculoskeletal:        General: No tenderness.     Comments: Trace BLE edema  Skin:    General: Skin is warm and dry.  Neurological:     Mental Status: He is alert and oriented to person, place, and time.  Psychiatric:        Behavior: Behavior normal.     Comments: Anxious appearing      ED Treatments / Results  Labs (all labs ordered are listed, but only abnormal results are displayed) Labs Reviewed  BASIC METABOLIC PANEL - Abnormal; Notable for the following  components:      Result Value   Glucose, Bld 162 (*)    All other components within normal limits  CBC WITH DIFFERENTIAL/PLATELET  TROPONIN I  D-DIMER, QUANTITATIVE (NOT AT [facility])    EKG EKG Interpretation  Date/Time:  Thursday Jul 20 2018 17:07:39 EDT Ventricular Rate:  94 PR Interval:  256 QRS Duration: 94 QT Interval:  326 QTC Calculation: 408 R Axis:   -37 Text Interpretation:  Sinus rhythm Prolonged PR interval Probable left atrial enlargement Left axis deviation Low voltage, precordial leads Consider anterior infarct Borderline repolarization abnormality Baseline wander in lead(s) V6 Confirmed by Tilden Fossa [phone]) on 07/20/2018 5:09:42 PM   Radiology Dg Chest Port 1 View  Result Date: 07/20/2018 CLINICAL DATA:  Shortness of breath. EXAM: PORTABLE CHEST 1 VIEW COMPARISON:  Chest x-ray dated Jul 14, 2018. FINDINGS: The heart size and mediastinal contours are within normal limits. Both lungs are clear. The visualized skeletal structures are unremarkable. IMPRESSION: No active disease. Electronically Signed   By: Obie Dredge M.D.   On: 07/20/2018 16:49    Procedures Procedures (including critical care time)  Medications Ordered in ED Medications  albuterol (VENTOLIN HFA) 108 (90 Base) MCG/ACT inhaler 2 puff (2 puffs Inhalation Given 07/20/18 1552)  hydrOXYzine (ATARAX/VISTARIL) tablet 10 mg (10 mg Oral Given 07/20/18 1613)     Initial Impression / Assessment and Plan / ED Course  I have reviewed the triage vital signs and the nursing notes.  Pertinent labs & imaging results that were available during my care of the patient were reviewed by me and considered in my medical decision making (see chart for details).        Patient with recent diagnosis of atrial fibrillation here for evaluation of palpitations. Patient is anxious appearing on ED arrival. He had no significant change in symptoms following albuterol treatment. On repeat assessment he did appear more  calm and relaxed after hydroxyzine but stated he had no improvement in his symptoms although he did say his symptoms were resolved on recheck. EKG with sinus rhythm, no evidence of current a fib. Presentation is not consistent with ACS, CHF, PE, dissection. Discussed with patient home care for palpitations. Plan to discharge home with outpatient follow-up and return precautions. Discussed continuing his Eliquis.    Current presentation is not c/w covid infection.    Final Clinical Impressions(s) / ED Diagnoses   Final diagnoses:  Palpitations    ED Discharge Orders    None       Tilden Fossa, MD 07/20/18 1719

## 2018-07-20 NOTE — ED Notes (Signed)
ED Provider at bedside. 

## 2018-07-20 NOTE — ED Notes (Signed)
XR at beside. 

## 2018-07-20 NOTE — ED Notes (Signed)
Pt incredibly anxious, pt sitting on side of bed. Pt taking off monitoring wires. Pt refuse to keep gown on, pt Pharmacy made aware of need for vistaril.

## 2018-07-21 ENCOUNTER — Encounter (HOSPITAL_COMMUNITY): Payer: Self-pay | Admitting: Physician Assistant

## 2018-07-21 ENCOUNTER — Ambulatory Visit (HOSPITAL_COMMUNITY)
Admission: RE | Admit: 2018-07-21 | Discharge: 2018-07-21 | Disposition: A | Discharge status: Home / Self Care | Payer: Medicare Other | Source: Ambulatory Visit | Attending: Physician Assistant | Admitting: Physician Assistant

## 2018-07-21 ENCOUNTER — Other Ambulatory Visit (HOSPITAL_COMMUNITY): Payer: Self-pay | Admitting: *Deleted

## 2018-07-21 VITALS — BP 136/84 | HR 80 | Ht 70.0 in | Wt 240.0 lb

## 2018-07-21 DIAGNOSIS — I480 Paroxysmal atrial fibrillation: Secondary | ICD-10-CM | POA: Diagnosis not present

## 2018-07-21 MED ORDER — METOPROLOL SUCCINATE ER 25 MG PO TB24: 25.0000 mg | ORAL_TABLET | Freq: Every day | ORAL | 3 refills | Status: DC

## 2018-07-21 NOTE — Progress Notes (Signed)
Electrophysiology TeleHealth Note   Due to national recommendations of social distancing due to COVID 19, Audio telehealth visit is felt to be most appropriate for this patient at this time.  See consent below from today for patient consent regarding telehealth for the Atrial Fibrillation Clinic. Consent obtained verbally.   Date:  07/21/2018   ID:  Mark Wood, DOB 27-Jun-1945, MRN [id number]  Location: home  Provider location: [address] [place], [place] [address] Evaluation Performed: New patient consult  PCP:  [name], MD   CC: Evaluation for atrial fibrillation   History of Present Illness: Mark Wood is a 73 y.o. male who presents via audio conferencing for a telehealth visit today.   The patient is referred for new consultation regarding new onset paroxysmal atrial fibrillation by [facility]. Patient has a history of COPD, Agent Orange exposure, fibromyalgia, tobacco abuse, and diabetes. Patient reports he was going to his PCP for a routine office visit when he was discovered to be in rate-controlled afib. He was sent to the ER for evaluation. At the time he was asymptomatic. He was given a prescription for Eliquis and discharged. One week later on 07/20/18 he presented to the ER again with symptoms of SOB and weakness. He was in SR at the time and symptoms resolved after dose of hydroxyzine. He denies any specific triggers. No significant alcohol use.  Today, he denies symptoms of palpitations, chest pain, shortness of breath, orthopnea, PND, claudication, dizziness, presyncope, syncope, bleeding, or neurologic sequela. The patient is tolerating medications without difficulties and is otherwise without complaint today.   he denies symptoms of cough, fevers, chills, or new SOB worrisome for COVID 19.  +chronic lower extremity edema.   Atrial Fibrillation Risk Factors:  he does not have symptoms or diagnosis of sleep apnea. he does not have a history of  alcohol use.   he has a BMI of Body mass index is 34.44 kg/m.Marland Kitchen Filed Weights   07/21/18 1321  Weight: 108.9 kg    Past Medical History:  Diagnosis Date  . Arthritis   . COPD (chronic obstructive pulmonary disease) (HCC)   . Diabetes mellitus without complication (HCC)   . Fibromyalgia   . Venous insufficiency    No past surgical history on file.   Current Outpatient Medications  Medication Sig Dispense Refill  . acyclovir (ZOVIRAX) 400 MG tablet Take 400 mg by mouth daily.    Marland Kitchen apixaban (ELIQUIS) 5 MG TABS tablet Take 1 tablet (5 mg total) by mouth 2 (two) times daily for 30 days. 60 tablet 1  . furosemide (LASIX) 80 MG tablet Take 80 mg by mouth daily.    . insulin glargine (LANTUS) 100 UNIT/ML injection Inject 17 Units into the skin daily.    . methadone (DOLOPHINE) 10 MG tablet Take 20 mg by mouth every 8 (eight) hours as needed for moderate pain.     . tamsulosin (FLOMAX) 0.4 MG CAPS capsule Take 0.4 mg by mouth daily after supper.     No current facility-administered medications for this encounter.     Allergies:   Patient has no known allergies.   Social History:  The patient  reports that he quit smoking about 11 years ago. He has never used smokeless tobacco. He reports that he does not drink alcohol.   Family History:  The patient's  family history is not on file.    ROS:  Please see the history of present illness.  All other systems are personally reviewed and negative.    Recent Labs: 07/14/2018: ALT 20; Magnesium 1.9; TSH 2.987 07/20/2018: BUN 12; Creatinine, Ser 0.79; Hemoglobin 14.6; Platelets 161; Potassium 3.9; Sodium 136  personally reviewed    Other studies personally reviewed: Additional studies/ records that were reviewed today include: Epic notes    ASSESSMENT AND PLAN:  1.  Paroxysmal atrial fibrillation Patient was asymptomatic on first presentation to ER when rate controlled. Second episode could be afib vs anxiety? General education  about atrial fibrillation and anticoagulation discussed and questions answered. Start Toprol 25 mg daily Continue Eliquis 5 mg BID Check echocardiogram. Normal TSH and mag at ER noted. Lifestyle changes as below.  This patients CHA2DS2-VASc Score and unadjusted Ischemic Stroke Rate (% per year) is equal to 2.2 % stroke rate/year from a score of 2  Above score calculated as 1 point each if present [CHF, HTN, DM, Vascular=MI/PAD/Aortic Plaque, Age if 65-74, or Male] Above score calculated as 2 points each if present [Age > 75, or Stroke/TIA/TE]  2. Obesity Body mass index is 34.44 kg/m. Lifestyle modification was discussed and encouraged including regular physical activity and weight reduction.   COVID screen The patient does not have any symptoms that suggest any further testing/ screening at this time.  Social distancing reinforced today.    Follow-up with AF clinic in one month.   Current medicines are reviewed at length with the patient today.   The patient does not have concerns regarding his medicines.  The following changes were made today:  Start metoprolol  Labs/ tests ordered today include:  No orders of the defined types were placed in this encounter.   Patient Risk:  after full review of this patients clinical status, I feel that they are at moderate risk at this time.   Today, I have spent 23 minutes with the patient with telehealth technology discussing atrial fibrillation, medications, ER precautions, and COVID-19 precautions.    Dalia Heading PA-C 07/21/2018 1:46 PM  Afib Clinic [facility] [address] [place], [place] [address] [phone]   I hereby voluntarily request, consent and authorize the Atrial Fibrillation Clinic and its employed or contracted physicians, physician assistants, nurse practitioners or other licensed health care professionals (the Practitioner), to provide me with telemedicine health care services (the  "Services") as deemed necessary by the treating Practitioner. I acknowledge and consent to receive the Services by the Practitioner via telemedicine. I understand that the telemedicine visit will involve communicating with the Practitioner through live audiovisual communication technology and the disclosure of certain medical information by electronic transmission. I acknowledge that I have been given the opportunity to request an in-person assessment or other available alternative prior to the telemedicine visit and am voluntarily participating in the telemedicine visit.   I understand that I have the right to withhold or withdraw my consent to the use of telemedicine in the course of my care at any time, without affecting my right to future care or treatment, and that the Practitioner or I may terminate the telemedicine visit at any time. I understand that I have the right to inspect all information obtained and/or recorded in the course of the telemedicine visit and may receive copies of available information for a reasonable fee.  I understand that some of the potential risks of receiving the Services via telemedicine include:   Delay or interruption in medical evaluation due to technological equipment failure or disruption;  Information transmitted may not be sufficient (e.g.  poor resolution of images) to allow for appropriate medical decision making by the Practitioner; and/or  In rare instances, security protocols could fail, causing a breach of personal health information.   Furthermore, I acknowledge that it is my responsibility to provide information about my medical history, conditions and care that is complete and accurate to the best of my ability. I acknowledge that Practitioner's advice, recommendations, and/or decision may be based on factors not within their control, such as incomplete or inaccurate data provided by me or distortions of diagnostic images or specimens that may result from  electronic transmissions. I understand that the practice of medicine is not an exact science and that Practitioner makes no warranties or guarantees regarding treatment outcomes. I acknowledge that I will receive a copy of this consent concurrently upon execution via email to the email address I last provided but may also request a printed copy by calling the office of the Atrial Fibrillation Clinic.  I understand that my insurance will be billed for this visit.   I have read or had this consent read to me.  I understand the contents of this consent, which adequately explains the benefits and risks of the Services being provided via telemedicine.  I have been provided ample opportunity to ask questions regarding this consent and the Services and have had my questions answered to my satisfaction.  I give my informed consent for the services to be provided through the use of telemedicine in my medical care  By participating in this telemedicine visit I agree to the above.

## 2018-07-27 ENCOUNTER — Other Ambulatory Visit (HOSPITAL_COMMUNITY): Payer: Self-pay | Admitting: *Deleted

## 2018-07-27 DIAGNOSIS — I480 Paroxysmal atrial fibrillation: Secondary | ICD-10-CM

## 2018-08-17 ENCOUNTER — Ambulatory Visit (HOSPITAL_BASED_OUTPATIENT_CLINIC_OR_DEPARTMENT_OTHER)
Admission: RE | Admit: 2018-08-17 | Discharge: 2018-08-17 | Disposition: A | Discharge status: Home / Self Care | Payer: Medicare Other | Source: Ambulatory Visit | Attending: Physician Assistant | Admitting: Physician Assistant

## 2018-08-17 ENCOUNTER — Other Ambulatory Visit: Payer: Self-pay

## 2018-08-17 ENCOUNTER — Ambulatory Visit (HOSPITAL_COMMUNITY)
Admission: RE | Admit: 2018-08-17 | Discharge: 2018-08-17 | Disposition: A | Discharge status: Home / Self Care | Payer: Medicare Other | Source: Ambulatory Visit | Attending: Physician Assistant | Admitting: Physician Assistant

## 2018-08-17 DIAGNOSIS — E119 Type 2 diabetes mellitus without complications: Secondary | ICD-10-CM | POA: Insufficient documentation

## 2018-08-17 DIAGNOSIS — Z87891 Personal history of nicotine dependence: Secondary | ICD-10-CM | POA: Insufficient documentation

## 2018-08-17 DIAGNOSIS — Z7901 Long term (current) use of anticoagulants: Secondary | ICD-10-CM | POA: Diagnosis not present

## 2018-08-17 DIAGNOSIS — J449 Chronic obstructive pulmonary disease, unspecified: Secondary | ICD-10-CM | POA: Insufficient documentation

## 2018-08-17 DIAGNOSIS — M1990 Unspecified osteoarthritis, unspecified site: Secondary | ICD-10-CM | POA: Diagnosis not present

## 2018-08-17 DIAGNOSIS — I872 Venous insufficiency (chronic) (peripheral): Secondary | ICD-10-CM | POA: Diagnosis not present

## 2018-08-17 DIAGNOSIS — M797 Fibromyalgia: Secondary | ICD-10-CM | POA: Insufficient documentation

## 2018-08-17 DIAGNOSIS — I480 Paroxysmal atrial fibrillation: Secondary | ICD-10-CM

## 2018-08-17 DIAGNOSIS — Z79899 Other long term (current) drug therapy: Secondary | ICD-10-CM | POA: Insufficient documentation

## 2018-08-17 DIAGNOSIS — Z794 Long term (current) use of insulin: Secondary | ICD-10-CM | POA: Diagnosis not present

## 2018-08-17 LAB — ECHOCARDIOGRAM COMPLETE
Height: 70 in
Weight: 3782.4 oz

## 2018-08-17 MED ORDER — PERFLUTREN LIPID MICROSPHERE
1.0000 mL | INTRAVENOUS | Status: AC | PRN
  Administered 2018-08-17: 2 mL via INTRAVENOUS
  Filled 2018-08-17: qty 10

## 2018-08-17 NOTE — Progress Notes (Signed)
Primary Care Physician: Leonard Downing, MD Referring Physician: Adline Peals, PA   Mark Wood is a 73 y.o. male with a h/o COPD, Agent Orange exposure, fibromyalgia, tobacco abuse, and diabetes. Patient reports he was going to his PCP for a routine office visit when he was discovered to be in rate-controlled afib. He was sent to the ER for evaluation. At the time he was asymptomatic. He was given a prescription for Eliquis and discharged. One week later on 07/20/18 he presented to the ER again with symptoms of SOB and weakness. He was in SR at the time and symptoms resolved after dose of hydroxyzine. He denies any specific triggers. No significant alcohol use.  He wasted to be seen for episodes of agitation,anxiousness, feeling that he has to get up and walk and fast speech. Usually his wife can calm him down. The symptoms will usually resolve after about 15 mins. His PCP started him on buspar and he feels this has improved his symptoms. HE was to ask if elequis may cause his symptoms and was reassured no. He does have h/o PTSD.He is also on testosterone shots for low T. He monitors his HR and BP and does not see any change in his HR, rate or rhythm with the episodes. His BP also stays in range.   Today, he denies symptoms of palpitations, chest pain, shortness of breath, orthopnea, PND, lower extremity edema, dizziness, presyncope, syncope, or neurologic sequela. The patient is tolerating medications without difficulties and is otherwise without complaint today.   Past Medical History:  Diagnosis Date  . Arthritis   . COPD (chronic obstructive pulmonary disease) ([place])   . Diabetes mellitus without complication ([place])   . Fibromyalgia   . Venous insufficiency    No past surgical history on file.  Current Outpatient Medications  Medication Sig Dispense Refill  . acyclovir (ZOVIRAX) 400 MG tablet Take 400 mg by mouth daily.    Marland Kitchen apixaban (ELIQUIS) 5 MG TABS tablet Take 1 tablet (5 mg  total) by mouth 2 (two) times daily for 30 days. 60 tablet 1  . busPIRone (BUSPAR) 10 MG tablet Take 10 mg by mouth 2 (two) times daily.    . furosemide (LASIX) 80 MG tablet Take 80 mg by mouth daily.    . insulin glargine (LANTUS) 100 UNIT/ML injection Inject 20 Units into the skin daily.     . methadone (DOLOPHINE) 10 MG tablet Take 20 mg by mouth every 8 (eight) hours as needed for moderate pain.     . metoprolol succinate (TOPROL XL) 25 MG 24 hr tablet Take 1 tablet (25 mg total) by mouth daily. 30 tablet 3  . potassium chloride SA (K-DUR) 20 MEQ tablet Take 20 mEq by mouth daily.    . tamsulosin (FLOMAX) 0.4 MG CAPS capsule Take 0.4 mg by mouth daily after supper.    . TESTOSTERONE IM Inject 2 mg into the muscle every 14 (fourteen) days.     No current facility-administered medications for this encounter.    Facility-Administered Medications Ordered in Other Encounters  Medication Dose Route Frequency Provider Last Rate Last Dose  . perflutren lipid microspheres (DEFINITY) IV suspension  1-10 mL Intravenous PRN Fenton, Clint R, PA   2 mL at 08/17/18 1541    No Known Allergies  Social History   Socioeconomic History  . Marital status: Married    Spouse name: Not on file  . Number of children: Not on file  . Years of education:  Not on file  . Highest education level: Not on file  Occupational History  . Not on file  Social Needs  . Financial resource strain: Not on file  . Food insecurity    Worry: Not on file    Inability: Not on file  . Transportation needs    Medical: Not on file    Non-medical: Not on file  Tobacco Use  . Smoking status: Former Smoker    Quit date: 10/07/2006    Years since quitting: 11.8  . Smokeless tobacco: Never Used  Substance and Sexual Activity  . Alcohol use: No    Alcohol/week: 0.0 standard drinks  . Drug use: Not on file  . Sexual activity: Not on file  Lifestyle  . Physical activity    Days per week: Not on file    Minutes per  session: Not on file  . Stress: Not on file  Relationships  . Social Musicianconnections    Talks on phone: Not on file    Gets together: Not on file    Attends religious service: Not on file    Active member of club or organization: Not on file    Attends meetings of clubs or organizations: Not on file    Relationship status: Not on file  . Intimate partner violence    Fear of current or ex partner: Not on file    Emotionally abused: Not on file    Physically abused: Not on file    Forced sexual activity: Not on file  Other Topics Concern  . Not on file  Social History Narrative  . Not on file    No family history on file.  ROS- All systems are reviewed and negative except as per the HPI above  Physical Exam: Vitals:   08/17/18 1359  BP: (!) 152/60  Pulse: (!) 58  Weight: 107.2 kg  Height: 5\' 10"  (1.778 m)   Wt Readings from Last 3 Encounters:  08/17/18 107.2 kg  07/21/18 108.9 kg  07/14/18 106.6 kg    Labs: Lab Results  Component Value Date   NA 136 07/20/2018   K 3.9 07/20/2018   CL 99 07/20/2018   CO2 27 07/20/2018   GLUCOSE 162 (H) 07/20/2018   BUN 12 07/20/2018   CREATININE 0.79 07/20/2018   CALCIUM 9.5 07/20/2018   MG 1.9 07/14/2018   No results found for: INR No results found for: CHOL, HDL, LDLCALC, TRIG   GEN- The patient is well appearing, alert and oriented x 3 today.   Head- normocephalic, atraumatic Eyes-  Sclera clear, conjunctiva pink Ears- hearing intact Oropharynx- clear Neck- supple, no JVP Lymph- no cervical lymphadenopathy Lungs- Clear to ausculation bilaterally, normal work of breathing Heart- Regular rate and rhythm, no murmurs, rubs or gallops, PMI not laterally displaced GI- soft, NT, ND, + BS Extremities- no clubbing, cyanosis, or edema MS- no significant deformity or atrophy Skin- no rash or lesion Psych- euthymic mood, full affect Neuro- strength and sensation are intact  EKG-sinus brady with first degree AV block, pr int 266  ms, qrs int 82 bpm, qtc 404 ms Epic records reviewed    Assessment and Plan: 1. Paroxysmal afib Pt is staying in rhythm I feel this latest dx of afib has trigger some anxiety and it sounds as thought the episodes are panic attacks Buspar has helped the episodes  He was reassured that eliquis does not cause the spells that he is describing He has a CHA2DS2VASc score of 2 and  at his BD in the fall it will be 4, so he should stay on eliquis. Continue Toprol without change. He does have an echo pending later today and he will be called with results and f/u.  Elvina Sidleonna C. Matthew Folksarroll, ANP-C [facility] [address] [place], [place] [address] [phone]

## 2018-08-17 NOTE — Progress Notes (Signed)
Echocardiogram 2D Echocardiogram has been performed.  Oneal Deputy Jennifier Smitherman 08/17/2018, 3:55 PM

## 2018-08-22 ENCOUNTER — Ambulatory Visit (HOSPITAL_COMMUNITY): Payer: Medicare Other | Admitting: Physician Assistant

## 2018-09-06 ENCOUNTER — Other Ambulatory Visit (HOSPITAL_COMMUNITY): Payer: Self-pay | Admitting: *Deleted

## 2018-09-06 ENCOUNTER — Encounter (HOSPITAL_COMMUNITY): Payer: Self-pay | Admitting: *Deleted

## 2018-09-06 DIAGNOSIS — I480 Paroxysmal atrial fibrillation: Secondary | ICD-10-CM

## 2018-09-29 ENCOUNTER — Other Ambulatory Visit (HOSPITAL_COMMUNITY): Payer: Self-pay | Admitting: *Deleted

## 2018-09-29 MED ORDER — APIXABAN 5 MG PO TABS: 5.0000 mg | ORAL_TABLET | Freq: Two times a day (BID) | ORAL | 11 refills | Status: DC

## 2018-11-28 ENCOUNTER — Other Ambulatory Visit (HOSPITAL_COMMUNITY): Payer: Self-pay | Admitting: Physician Assistant

## 2019-03-28 ENCOUNTER — Ambulatory Visit: Payer: Medicare Other | Attending: Family Medicine

## 2019-03-28 DIAGNOSIS — Z23 Encounter for immunization: Secondary | ICD-10-CM | POA: Insufficient documentation

## 2019-03-28 NOTE — Progress Notes (Signed)
   Covid-19 Vaccination Clinic  Name:  Mark Wood    MRN: [id number] DOB: 08-01-1945  03/28/2019  Mark Wood was observed post Covid-19 immunization for 15 minutes without incidence. He was provided with Vaccine Information Sheet and instruction to access the V-Safe system.   Mark Wood was instructed to call 911 with any severe reactions post vaccine: Marland Kitchen Difficulty breathing  . Swelling of your face and throat  . A fast heartbeat  . A bad rash all over your body  . Dizziness and weakness    Immunizations Administered    Name Date Dose VIS Date Route   Pfizer COVID-19 Vaccine 03/28/2019 11:25 AM 0.3 mL 02/16/2019 Intramuscular   Manufacturer: [employer], [employer]   Lot: [id number]   NDC: [address]-6

## 2019-04-16 ENCOUNTER — Ambulatory Visit: Payer: Medicare Other | Attending: Internal Medicine

## 2019-04-16 DIAGNOSIS — Z23 Encounter for immunization: Secondary | ICD-10-CM

## 2019-04-16 NOTE — Progress Notes (Signed)
   Covid-19 Vaccination Clinic  Name:  Mark Wood    MRN: [id number] DOB: 01-24-1946  04/16/2019  Mark Wood was observed post Covid-19 immunization for 15 minutes without incidence. He was provided with Vaccine Information Sheet and instruction to access the V-Safe system.   Mark Wood was instructed to call 911 with any severe reactions post vaccine: Marland Kitchen Difficulty breathing  . Swelling of your face and throat  . A fast heartbeat  . A bad rash all over your body  . Dizziness and weakness    Immunizations Administered    Name Date Dose VIS Date Route   Pfizer COVID-19 Vaccine 04/16/2019  5:24 PM 0.3 mL 02/16/2019 Intramuscular   Manufacturer: [employer], [employer]   Lot: [id number]   NDC: [address]-3

## 2019-06-13 ENCOUNTER — Other Ambulatory Visit: Payer: Self-pay | Admitting: Family Medicine

## 2019-06-13 DIAGNOSIS — R5381 Other malaise: Secondary | ICD-10-CM

## 2019-06-19 ENCOUNTER — Other Ambulatory Visit: Payer: Self-pay | Admitting: Family Medicine

## 2019-06-19 DIAGNOSIS — R748 Abnormal levels of other serum enzymes: Secondary | ICD-10-CM

## 2019-06-29 ENCOUNTER — Encounter (HOSPITAL_COMMUNITY): Payer: Medicare Other

## 2019-06-29 ENCOUNTER — Encounter (HOSPITAL_COMMUNITY): Payer: Self-pay

## 2019-06-29 ENCOUNTER — Encounter (HOSPITAL_COMMUNITY): Payer: Medicare Other | Attending: Family Medicine

## 2019-08-03 ENCOUNTER — Ambulatory Visit: Payer: Medicare Other | Admitting: Physician Assistant

## 2019-11-13 ENCOUNTER — Ambulatory Visit: Payer: Medicare Other | Admitting: Physician Assistant

## 2019-11-22 ENCOUNTER — Encounter: Payer: Self-pay | Admitting: Internal Medicine

## 2019-11-22 ENCOUNTER — Other Ambulatory Visit: Payer: Self-pay

## 2019-11-22 ENCOUNTER — Ambulatory Visit: Payer: Medicare Other | Admitting: Internal Medicine

## 2019-11-22 VITALS — BP 171/67 | HR 82 | Ht 70.0 in | Wt 232.2 lb

## 2019-11-22 DIAGNOSIS — I480 Paroxysmal atrial fibrillation: Secondary | ICD-10-CM

## 2019-11-22 DIAGNOSIS — E119 Type 2 diabetes mellitus without complications: Secondary | ICD-10-CM

## 2019-11-22 DIAGNOSIS — R5383 Other fatigue: Secondary | ICD-10-CM | POA: Diagnosis not present

## 2019-11-22 DIAGNOSIS — E785 Hyperlipidemia, unspecified: Secondary | ICD-10-CM

## 2019-11-22 DIAGNOSIS — Z794 Long term (current) use of insulin: Secondary | ICD-10-CM

## 2019-11-22 DIAGNOSIS — R0600 Dyspnea, unspecified: Secondary | ICD-10-CM

## 2019-11-22 NOTE — Patient Instructions (Signed)
Medication Instructions:  Your physician recommends that you continue on your current medications as directed. Please refer to the Current Medication list given to you today.  *If you need a refill on your cardiac medications before your next appointment, please call your pharmacy*  Lab Work: Your physician recommends that you return for lab work TODAY:   CBC  CMET  A1C  FLP If you have labs (blood work) drawn today and your tests are completely normal, you will receive your results only by: Marland Kitchen MyChart Message (if you have MyChart) OR . A paper copy in the mail If you have any lab test that is abnormal or we need to change your treatment, we will call you to review the results.  Testing/Procedures: Your physician has requested that you have a lexiscan myoview. For further information please visit [email or web address] Please follow instruction sheet, as given.   Please schedule for 1-2 weeks   Follow-Up: At [facility], you and your health needs are our priority.  As part of our continuing mission to provide you with exceptional heart care, we have created designated Provider Care Teams.  These Care Teams include your primary Cardiologist (physician) and Advanced Practice Providers (APPs -  Physician Assistants and Nurse Practitioners) who all work together to provide you with the care you need, when you need it.   Your next appointment:   4-6 week(s)  The format for your next appointment:   In Person  Provider:   K. Italy Hilty, MD   Other Instructions  Continue to monitor blood pressure (BP) at home. Bring in BP machine and a log of your home BP readings to your next appointment.

## 2019-11-22 NOTE — Progress Notes (Signed)
OFFICE CONSULT NOTE  Chief Complaint:  Progressive fatigue and dyspnea  Primary Care Physician: [name], MD  HPI:  Mark Wood is a 74 y.o. male who is being seen today for the evaluation of fatigue and dyspnea at the request of [name], *.  This is a pleasant 74 year old male kindly referred by [name] for progressive fatigue and dyspnea.  He has a history of paroxysmal A. fib and was seen once at the A. fib clinic by [name], NP.  These episodes were felt to be related to anxiety as there was some improvement when taking BuSpar or Valium.  He reports more recent panic attacks.  He says he has crescendo fatigue and worsening dyspnea which occurs mostly with exertion.  He denies any anginal symptoms.  He is noted to be in A. fib today which is rate controlled.  He takes Eliquis for anticoagulation but is on no rate controlling agents.  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  Blood pressure today was quite elevated 171/67, however he says it is typically much lower at home.  He had no recent lab work.  Unfortunately did not receive paperwork from his primary care provider with any recent office information.  He is listed as having COPD and said he smoked on and off for a number of years.  He also has type 2 diabetes on insulin and takes chronic methadone for fibromyalgia.  He gets some care through the [facility] hospitals as well.  PMHx:  Past Medical History:  Diagnosis Date  . Arthritis   . COPD (chronic obstructive pulmonary disease) ([facility])   . Diabetes mellitus without complication ([facility])   . Fibromyalgia   . Venous insufficiency     No past surgical history on file.  FAMHx:  Family History  Problem Relation Age of Onset  . CAD Father     SOCHx:   reports that he quit smoking about 13 years ago. He has never used smokeless tobacco. He reports that he does not drink alcohol. No history on file for drug use.  ALLERGIES:   No Known Allergies  ROS: Pertinent items noted in HPI and remainder of comprehensive ROS otherwise negative.  HOME MEDS: Current Outpatient Medications on File Prior to Visit  Medication Sig Dispense Refill  . acyclovir (ZOVIRAX) 400 MG tablet Take 400 mg by mouth daily.    Marland Kitchen apixaban (ELIQUIS) 5 MG TABS tablet Take 1 tablet (5 mg total) by mouth 2 (two) times daily. 60 tablet 11  . busPIRone (BUSPAR) 10 MG tablet Take 10 mg by mouth 2 (two) times daily.    . furosemide (LASIX) 80 MG tablet Take 80 mg by mouth daily.    . insulin glargine (LANTUS) 100 UNIT/ML injection Inject 20 Units into the skin daily.     . methadone (DOLOPHINE) 10 MG tablet Take 20 mg by mouth every 8 (eight) hours as needed for moderate pain.     . potassium chloride SA (K-DUR) 20 MEQ tablet Take 20 mEq by mouth daily.    . tamsulosin (FLOMAX) 0.4 MG CAPS capsule Take 0.4 mg by mouth daily after supper.    . TESTOSTERONE IM Inject 2 mg into the muscle every 14 (fourteen) days.     No current facility-administered medications on file prior to visit.    LABS/IMAGING: No results found for this or any previous visit (from the past 48 hour(s)). No results found.  LIPID PANEL: No  results found for: CHOL, TRIG, HDL, CHOLHDL, VLDL, LDLCALC, LDLDIRECT  WEIGHTS: Wt Readings from Last 3 Encounters:  11/22/19 232 lb 3.2 oz (105.3 kg)  08/17/18 236 lb 6.4 oz (107.2 kg)  07/21/18 240 lb (108.9 kg)    VITALS: BP (!) 171/67   Pulse 82   Ht 5\' 10"  (1.778 m)   Wt 232 lb 3.2 oz (105.3 kg)   SpO2 97%   BMI 33.32 kg/m   EXAM: General appearance: alert and no distress Neck: no carotid bruit, no JVD and thyroid not enlarged, symmetric, no tenderness/mass/nodules Lungs: clear to auscultation bilaterally Heart: irregularly irregular rhythm and No murmur Abdomen: soft, non-tender; bowel sounds normal; no masses,  no organomegaly Extremities: extremities normal, atraumatic, no cyanosis or edema Pulses: 2+ and  symmetric Skin: Skin color, texture, turgor normal. No rashes or lesions Neurologic: Grossly normal Psych: Pleasant  EKG: Atrial fibrillation at 82, pulmonary disease pattern, left anterior fascicular block and minimal voltage criteria for LVH- personally reviewed  ASSESSMENT: 1. Progressive fatigue and dyspnea 2. Fibromyalgia on chronic methadone 3. Persistent atrial fibrillation-CHA2DS2-VASc score of 4 on Eliquis 4. Insulin-dependent diabetes 5. Family history of premature coronary disease 6. Anxiety with panic attacks 7. COPD  PLAN: 1.   Mr. Kneece has had some progressive fatigue and dyspnea which seems to be subacute and progressive.  It seems to be no more notable with exertion.  He does have a history of COPD on his chart and was in on and off smoker.  Physical exam supports some findings of COPD including increased AP diameter.  Lung sounds are distant.  This might be progressive COPD or could be coronary disease.  He did have an echo which showed normal LV function in 2020.  I like for him to undergo Myoview stress testing to rule out any ischemia.  If this is low risk then would pursue pulmonary function testing and possible pulmonary evaluation.  We will also need to obtain labs including a comprehensive metabolic profile, CBC, A1c, TSH and fasting lipid profile and treat accordingly.  Plan follow-up with me in 4 to 6 weeks.  Marthann Schiller, MD, [facility], FACP  [place]  [facility]  Medical Director of the Advanced Lipid Disorders &  Cardiovascular Risk Reduction Clinic Diplomate of the American Board of Clinical Lipidology Attending Cardiologist  Direct Dial: [phone]  Fax: [phone]  Website:  [email or web address] Maelys [name] 11/22/2019, 8:49 AM

## 2019-11-23 LAB — HEMOGLOBIN A1C
Est. average glucose Bld gHb Est-mCnc: 189 mg/dL
Hgb A1c MFr Bld: 8.2 % — ABNORMAL HIGH (ref 4.8–5.6)

## 2019-11-23 LAB — COMPREHENSIVE METABOLIC PANEL
ALT: 14 IU/L (ref 0–44)
AST: 19 IU/L (ref 0–40)
Albumin/Globulin Ratio: 1.3 (ref 1.2–2.2)
Albumin: 4.4 g/dL (ref 3.7–4.7)
Alkaline Phosphatase: 63 IU/L (ref 44–121)
BUN/Creatinine Ratio: 20 (ref 10–24)
BUN: 19 mg/dL (ref 8–27)
Bilirubin Total: 0.6 mg/dL (ref 0.0–1.2)
CO2: 27 mmol/L (ref 20–29)
Calcium: 9.3 mg/dL (ref 8.6–10.2)
Chloride: 94 mmol/L — ABNORMAL LOW (ref 96–106)
Creatinine, Ser: 0.96 mg/dL (ref 0.76–1.27)
GFR calc Af Amer: 89 mL/min/{1.73_m2} (ref >59)
GFR calc non Af Amer: 77 mL/min/{1.73_m2} (ref >59)
Globulin, Total: 3.4 g/dL (ref 1.5–4.5)
Glucose: 189 mg/dL — ABNORMAL HIGH (ref 65–99)
Potassium: 4.2 mmol/L (ref 3.5–5.2)
Sodium: 137 mmol/L (ref 134–144)
Total Protein: 7.8 g/dL (ref 6.0–8.5)

## 2019-11-23 LAB — CBC
Hematocrit: 53.7 % — ABNORMAL HIGH (ref 37.5–51.0)
Hemoglobin: 16.3 g/dL (ref 13.0–17.7)
MCH: 23.8 pg — ABNORMAL LOW (ref 26.6–33.0)
MCHC: 30.4 g/dL — ABNORMAL LOW (ref 31.5–35.7)
MCV: 78 fL — ABNORMAL LOW (ref 79–97)
Platelets: 185 10*3/uL (ref 150–450)
RBC: 6.85 x10E6/uL — ABNORMAL HIGH (ref 4.14–5.80)
RDW: 17.2 % — ABNORMAL HIGH (ref 11.6–15.4)
WBC: 7.5 10*3/uL (ref 3.4–10.8)

## 2019-11-23 LAB — TSH: TSH: 2.46 u[IU]/mL (ref 0.450–4.500)

## 2019-11-23 LAB — LIPID PANEL
Chol/HDL Ratio: 3.5 ratio (ref 0.0–5.0)
Cholesterol, Total: 116 mg/dL (ref 100–199)
HDL: 33 mg/dL — ABNORMAL LOW (ref >39)
LDL Chol Calc (NIH): 64 mg/dL (ref 0–99)
Triglycerides: 97 mg/dL (ref 0–149)
VLDL Cholesterol Cal: 19 mg/dL (ref 5–40)

## 2019-11-28 ENCOUNTER — Telehealth (HOSPITAL_COMMUNITY): Payer: Self-pay | Admitting: *Deleted

## 2019-11-28 NOTE — Telephone Encounter (Signed)
Patient given detailed instructions per Myocardial Perfusion Study Information Sheet for the test on 12/05/2019 at 1045. Patient notified to arrive 15 minutes early and that it is imperative to arrive on time for appointment to keep from having the test rescheduled.  If you need to cancel or reschedule your appointment, please call the office within 24 hours of your appointment. . Patient verbalized understanding.Mark Wood, Mark Wood Patient did not want mychart letter sent.

## 2019-12-05 ENCOUNTER — Ambulatory Visit (HOSPITAL_COMMUNITY): Payer: Medicare Other | Attending: Internal Medicine

## 2019-12-05 ENCOUNTER — Other Ambulatory Visit: Payer: Self-pay

## 2019-12-05 DIAGNOSIS — R5383 Other fatigue: Secondary | ICD-10-CM

## 2019-12-05 DIAGNOSIS — I480 Paroxysmal atrial fibrillation: Secondary | ICD-10-CM

## 2019-12-05 DIAGNOSIS — R0600 Dyspnea, unspecified: Secondary | ICD-10-CM | POA: Diagnosis present

## 2019-12-05 LAB — MYOCARDIAL PERFUSION IMAGING
LV dias vol: 159 mL (ref 62–150)
LV sys vol: 84 mL
Peak HR: 93 {beats}/min
Rest HR: 75 {beats}/min
SDS: 1
SRS: 0
SSS: 1
TID: 1.05

## 2019-12-05 MED ORDER — REGADENOSON 0.4 MG/5ML IV SOLN
0.4000 mg | Freq: Once | INTRAVENOUS | Status: AC
  Administered 2019-12-05: 0.4 mg via INTRAVENOUS

## 2019-12-05 MED ORDER — TECHNETIUM TC 99M TETROFOSMIN IV KIT
11.0000 | PACK | Freq: Once | INTRAVENOUS | Status: AC | PRN
  Administered 2019-12-05: 11 via INTRAVENOUS
  Filled 2019-12-05: qty 11

## 2019-12-05 MED ORDER — TECHNETIUM TC 99M TETROFOSMIN IV KIT
31.9000 | PACK | Freq: Once | INTRAVENOUS | Status: AC | PRN
  Administered 2019-12-05: 31.9 via INTRAVENOUS
  Filled 2019-12-05: qty 32

## 2020-01-10 ENCOUNTER — Encounter: Payer: Self-pay | Admitting: Internal Medicine

## 2020-01-10 ENCOUNTER — Ambulatory Visit: Payer: Medicare Other | Admitting: Internal Medicine

## 2020-01-10 ENCOUNTER — Other Ambulatory Visit: Payer: Self-pay

## 2020-01-10 VITALS — BP 160/70 | HR 64 | Ht 70.0 in | Wt 236.6 lb

## 2020-01-10 DIAGNOSIS — I83813 Varicose veins of bilateral lower extremities with pain: Secondary | ICD-10-CM

## 2020-01-10 DIAGNOSIS — R0600 Dyspnea, unspecified: Secondary | ICD-10-CM | POA: Diagnosis not present

## 2020-01-10 DIAGNOSIS — I4819 Other persistent atrial fibrillation: Secondary | ICD-10-CM | POA: Diagnosis not present

## 2020-01-10 DIAGNOSIS — E119 Type 2 diabetes mellitus without complications: Secondary | ICD-10-CM

## 2020-01-10 DIAGNOSIS — Z794 Long term (current) use of insulin: Secondary | ICD-10-CM

## 2020-01-10 NOTE — Progress Notes (Signed)
OFFICE CONSULT NOTE  Chief Complaint:  Follow-up  Primary Care Physician: [name], MD  HPI:  Mark Wood is a 74 y.o. male who is being seen today for the evaluation of fatigue and dyspnea at the request of [name], *.  This is a pleasant 74 year old male kindly referred by [name] for progressive fatigue and dyspnea.  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 He has a history of paroxysmal A. fib and was seen once at the A. fib clinic by [name], NP.  These episodes were felt to be related to anxiety as there was some improvement when taking BuSpar or Valium.  He reports more recent panic attacks.  He says he has crescendo fatigue and worsening dyspnea which occurs mostly with exertion.  He denies any anginal symptoms.  He is noted to be in A. fib today which is rate controlled.  He takes Eliquis for anticoagulation but is on no rate controlling agents.  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  Blood pressure today was quite elevated 171/67, however he says it is typically much lower at home.  He had no recent lab work.  Unfortunately did not receive paperwork from his primary care provider with any recent office information.  He is listed as having COPD and said he smoked on and off for a number of years.  He also has type 2 diabetes on insulin and takes chronic methadone for fibromyalgia.  He gets some care through the [facility] hospitals as well.  01/10/2020  [name] returns for follow-up.  Went Lexiscan Myoview stress testing which showed no ischemia but a reduced LVEF of 47%.  Previously he had normal systolic function by echo last year.  I suspect that this is an imaging artifact as his EF appeared visually a little higher.  I had offered to repeat echo but he declined.  He does get a lot of his care through the [facility] hospitals.  Today in follow-up he says his dyspnea comes and goes and is  associated with the weather.  I suspect this is related to underlying COPD and I recommended pulmonary evaluation.  He says he seen a pulmonologist before at the [place] and would like to reach out to them for further evaluation.  He is complaining of leg pain when walking, heaviness and is noted to have significant varicose veins of his legs.  He had recent lipid testing in September which showed total cholesterol 116, HDL 33, LDL 64 and triglycerides 97.  Hemoglobin A1c was above goal at 8.2.  PMHx:  Past Medical History:  Diagnosis Date  . Arthritis   . COPD (chronic obstructive pulmonary disease) ([facility])   . Diabetes mellitus without complication ([facility])   . Fibromyalgia   . Venous insufficiency     No past surgical history on file.  FAMHx:  Family History  Problem Relation Age of Onset  . CAD Father     SOCHx:   reports that he quit smoking about 13 years ago. He has never used smokeless tobacco. He reports that he does not drink alcohol. No history on file for drug use.  ALLERGIES:  No Known Allergies  ROS: Pertinent items noted in HPI and remainder of comprehensive ROS otherwise negative.  HOME MEDS: Current Outpatient Medications on File Prior to Visit  Medication Sig Dispense Refill  . acyclovir (ZOVIRAX) 400 MG tablet Take 400 mg by mouth daily.    Marland Kitchen  apixaban (ELIQUIS) 5 MG TABS tablet Take 1 tablet (5 mg total) by mouth 2 (two) times daily. 60 tablet 11  . busPIRone (BUSPAR) 10 MG tablet Take 10 mg by mouth 2 (two) times daily.    . diazepam (VALIUM) 2 MG tablet As needed    . furosemide (LASIX) 80 MG tablet Take 80 mg by mouth daily.    . insulin glargine (LANTUS) 100 UNIT/ML injection Inject 20 Units into the skin daily.     . methadone (DOLOPHINE) 10 MG tablet Take 20 mg by mouth every 8 (eight) hours as needed for moderate pain.     . potassium chloride SA (K-DUR) 20 MEQ tablet Take 20 mEq by mouth daily.    . tamsulosin (FLOMAX) 0.4 MG CAPS capsule Take 0.4 mg by mouth  daily after supper.    . TESTOSTERONE IM Inject 2 mg into the muscle every 14 (fourteen) days.     No current facility-administered medications on file prior to visit.    LABS/IMAGING: No results found for this or any previous visit (from the past 48 hour(s)). No results found.  LIPID PANEL:    Component Value Date/Time   CHOL 116 11/22/2019 0915   TRIG 97 11/22/2019 0915   HDL 33 (L) 11/22/2019 0915   CHOLHDL 3.5 11/22/2019 0915   LDLCALC 64 11/22/2019 0915    WEIGHTS: Wt Readings from Last 3 Encounters:  01/10/20 236 lb 9.6 oz (107.3 kg)  12/05/19 232 lb (105.2 kg)  11/22/19 232 lb 3.2 oz (105.3 kg)    VITALS: BP (!) 160/70   Pulse 64   Ht 5\' 10"  (1.778 m)   Wt 236 lb 9.6 oz (107.3 kg)   SpO2 94%   BMI 33.95 kg/m   EXAM: General appearance: alert and no distress Neck: no carotid bruit, no JVD and thyroid not enlarged, symmetric, no tenderness/mass/nodules Lungs: clear to auscultation bilaterally Heart: irregularly irregular rhythm and No murmur Abdomen: soft, non-tender; bowel sounds normal; no masses,  no organomegaly Extremities: extremities normal, atraumatic, no cyanosis or edema Pulses: 2+ and symmetric Skin: Skin color, texture, turgor normal. No rashes or lesions Neurologic: Grossly normal Psych: Pleasant  EKG: Deferred  ASSESSMENT: 1. Progressive fatigue and dyspnea -low risk Myoview stress test, LVEF 47% (prior echo showed normal EF last year). 2. Fibromyalgia on chronic methadone 3. Persistent atrial fibrillation-CHA2DS2-VASc score of 4 on Eliquis 4. Insulin-dependent diabetes 5. Family history of premature coronary disease 6. Anxiety with panic attacks 7. COPD 8. Leg pain with varicose veins.  PLAN: 1.   Mr. Schreier has had some progressive fatigue and dyspnea but had a low risk Myoview stress test.  EF was 47% however it is not clear if this is artifactual.  His echo last year showed normal LVEF.  I recommended a repeat echo but he declined.   I also suspect COPD is playing a big role in his shortness of breath and recommended a referral to pulmonary however he said he would like to follow-up with the Marthann Schiller for that.  He does have concerns about bilateral leg pain with walking.  He says his feet are cool and he notes swelling.  On exam he has significant varicosities and I suspect has symptomatic varicose veins but could have underlying PAD.  Would recommend lower extremity arterial Dopplers and I provided prescriptions for 20 to 30 mm knee-high compression stockings however advised him not to start wearing them on till after we are certain he does not have any PAD.  Plan follow-up with me as necessary.  Chrystie Nose, MD, [facility], FACP  [place]  [facility]  Medical Director of the Advanced Lipid Disorders &  Cardiovascular Risk Reduction Clinic Diplomate of the American Board of Clinical Lipidology Attending Cardiologist  Direct Dial: [phone]  Fax: [phone]  Website:  [email or web address] Nicely Cristella Stiver 01/10/2020, 2:55 PM

## 2020-01-10 NOTE — Patient Instructions (Addendum)
Medication Instructions:  No changes *If you need a refill on your cardiac medications before your next appointment, please call your pharmacy*   Lab Work: None ordered If you have labs (blood work) drawn today and your tests are completely normal, you will receive your results only by: Marland Kitchen MyChart Message (if you have MyChart) OR . A paper copy in the mail If you have any lab test that is abnormal or we need to change your treatment, we will call you to review the results.   Testing/Procedures: Your physician has requested that you have a lower extremity arterial duplex. This test is an ultrasound of the arteries in the legs. It looks at arterial blood flow in the legs. Allow one hour for Lower and Upper Arterial scans. There are no restrictions or special instructions    Follow-Up: At [facility], you and your health needs are our priority.  As part of our continuing mission to provide you with exceptional heart care, we have created designated Provider Care Teams.  These Care Teams include your primary Cardiologist (physician) and Advanced Practice Providers (APPs -  Physician Assistants and Nurse Practitioners) who all work together to provide you with the care you need, when you need it.  We recommend signing up for the patient portal called "MyChart".  Sign up information is provided on this After Visit Summary.  MyChart is used to connect with patients for Virtual Visits (Telemedicine).  Patients are able to view lab/test results, encounter notes, upcoming appointments, etc.  Non-urgent messages can be sent to your provider as well.   To learn more about what you can do with MyChart, go to [email or web address].    Your next appointment:   12 month(s)  The format for your next appointment:   In Person  Provider:   K. Italy Hilty, MD   Other Instructions Bilateral compression stockings: 20 - 30 mmHg - DO NOT WEAR UNTIL INSTRUCTED TO DO SO BY DR. HILTY AFTER RESULTS OF  ARTERIAL DOPPLERS ARE REVIEWED

## 2020-01-11 ENCOUNTER — Encounter: Payer: Self-pay | Admitting: Internal Medicine

## 2020-01-16 ENCOUNTER — Other Ambulatory Visit (HOSPITAL_COMMUNITY): Payer: Self-pay | Admitting: Internal Medicine

## 2020-01-16 DIAGNOSIS — I739 Peripheral vascular disease, unspecified: Secondary | ICD-10-CM

## 2020-01-23 ENCOUNTER — Encounter (HOSPITAL_COMMUNITY): Payer: Self-pay

## 2020-01-23 ENCOUNTER — Ambulatory Visit (HOSPITAL_COMMUNITY)
Admission: RE | Admit: 2020-01-23 | Discharge: 2020-01-23 | Disposition: A | Discharge status: Home / Self Care | Payer: Medicare Other | Source: Ambulatory Visit | Attending: Cardiology | Admitting: Cardiology

## 2020-01-23 DIAGNOSIS — I83813 Varicose veins of bilateral lower extremities with pain: Secondary | ICD-10-CM

## 2020-01-23 DIAGNOSIS — I739 Peripheral vascular disease, unspecified: Secondary | ICD-10-CM

## 2021-04-11 IMAGING — DX PORTABLE CHEST - 1 VIEW
1 series · 1 of 1 positions shown · non-contrast
Comparison: 03/07/2003

CLINICAL DATA: New onset atrial fibrillation

EXAM:
PORTABLE CHEST 1 VIEW

[chest ap]
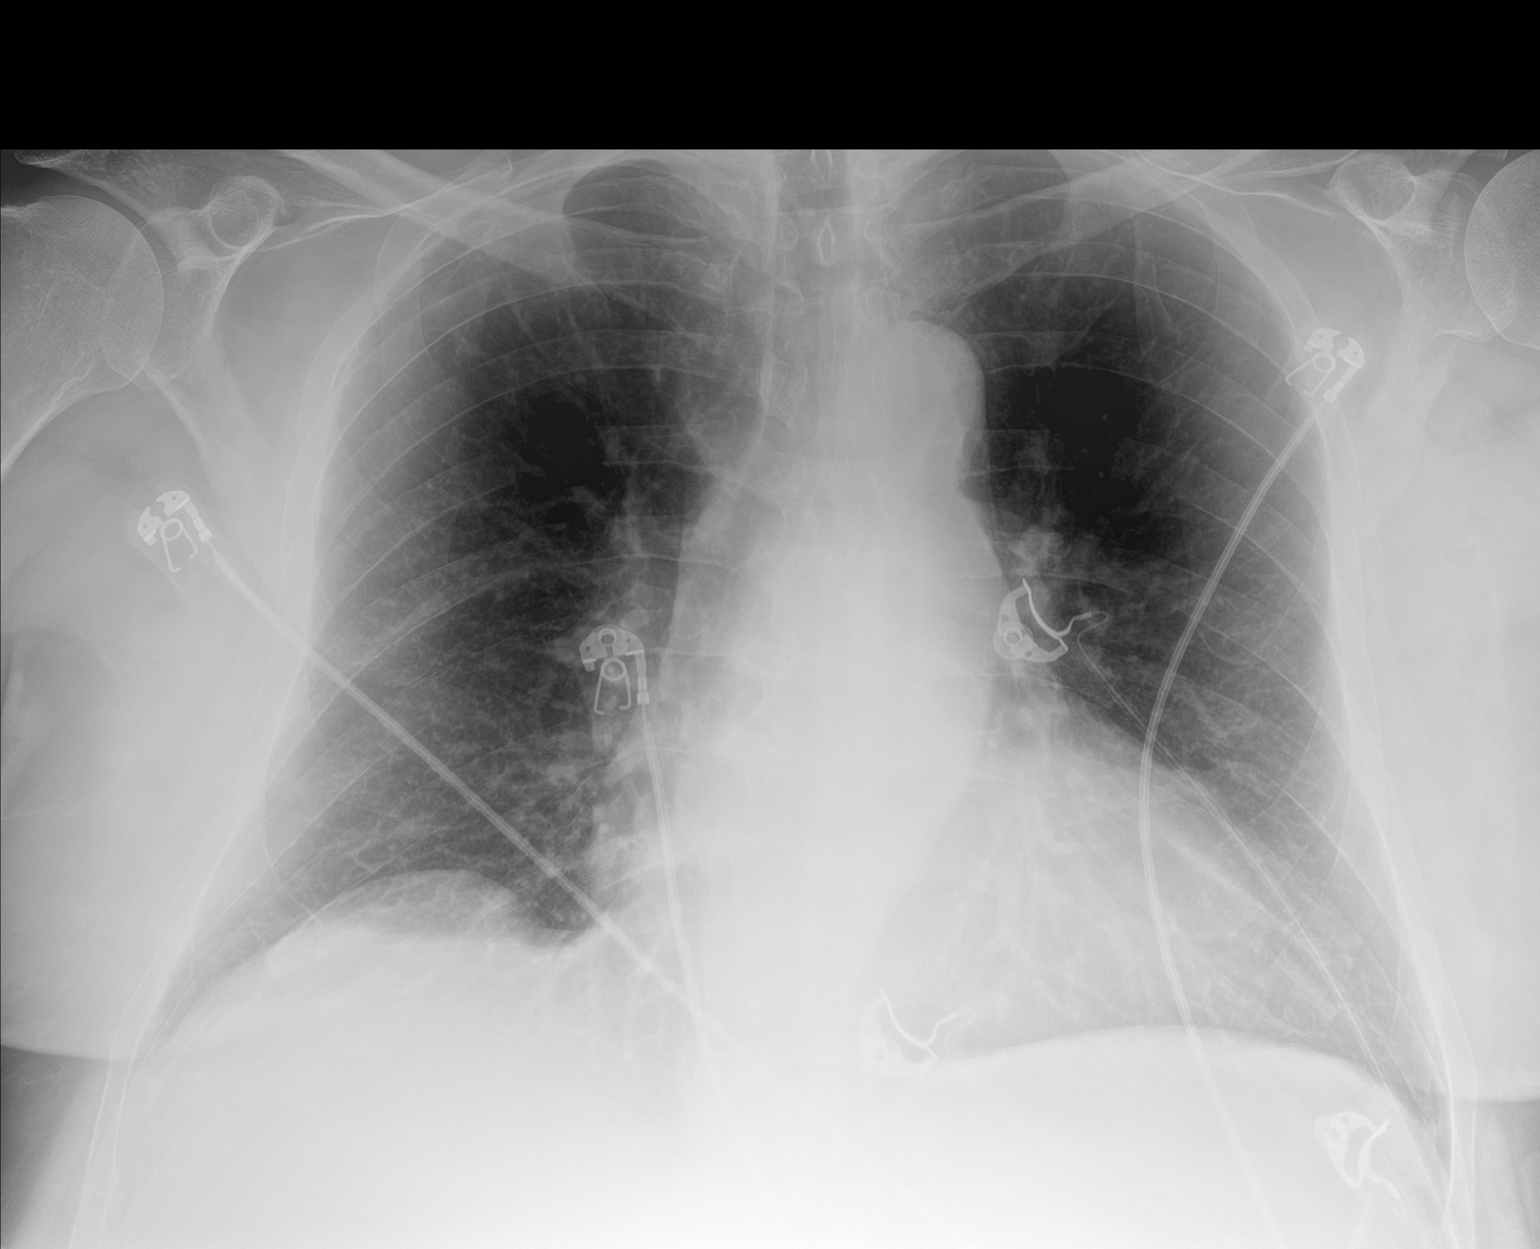

[1 of 1 positions shown; findings below may reference images not displayed]

FINDINGS: There is no focal parenchymal opacity. There is no pleural effusion
or pneumothorax. The cardiomediastinal silhouette is stable.

The osseous structures are unremarkable.
IMPRESSION: No active disease.

## 2021-04-17 IMAGING — DX PORTABLE CHEST - 1 VIEW
1 series · 1 of 1 positions shown · non-contrast
Comparison: Chest x-ray dated July 14, 2018.

CLINICAL DATA: Shortness of breath.

EXAM:
PORTABLE CHEST 1 VIEW

[chest ap]
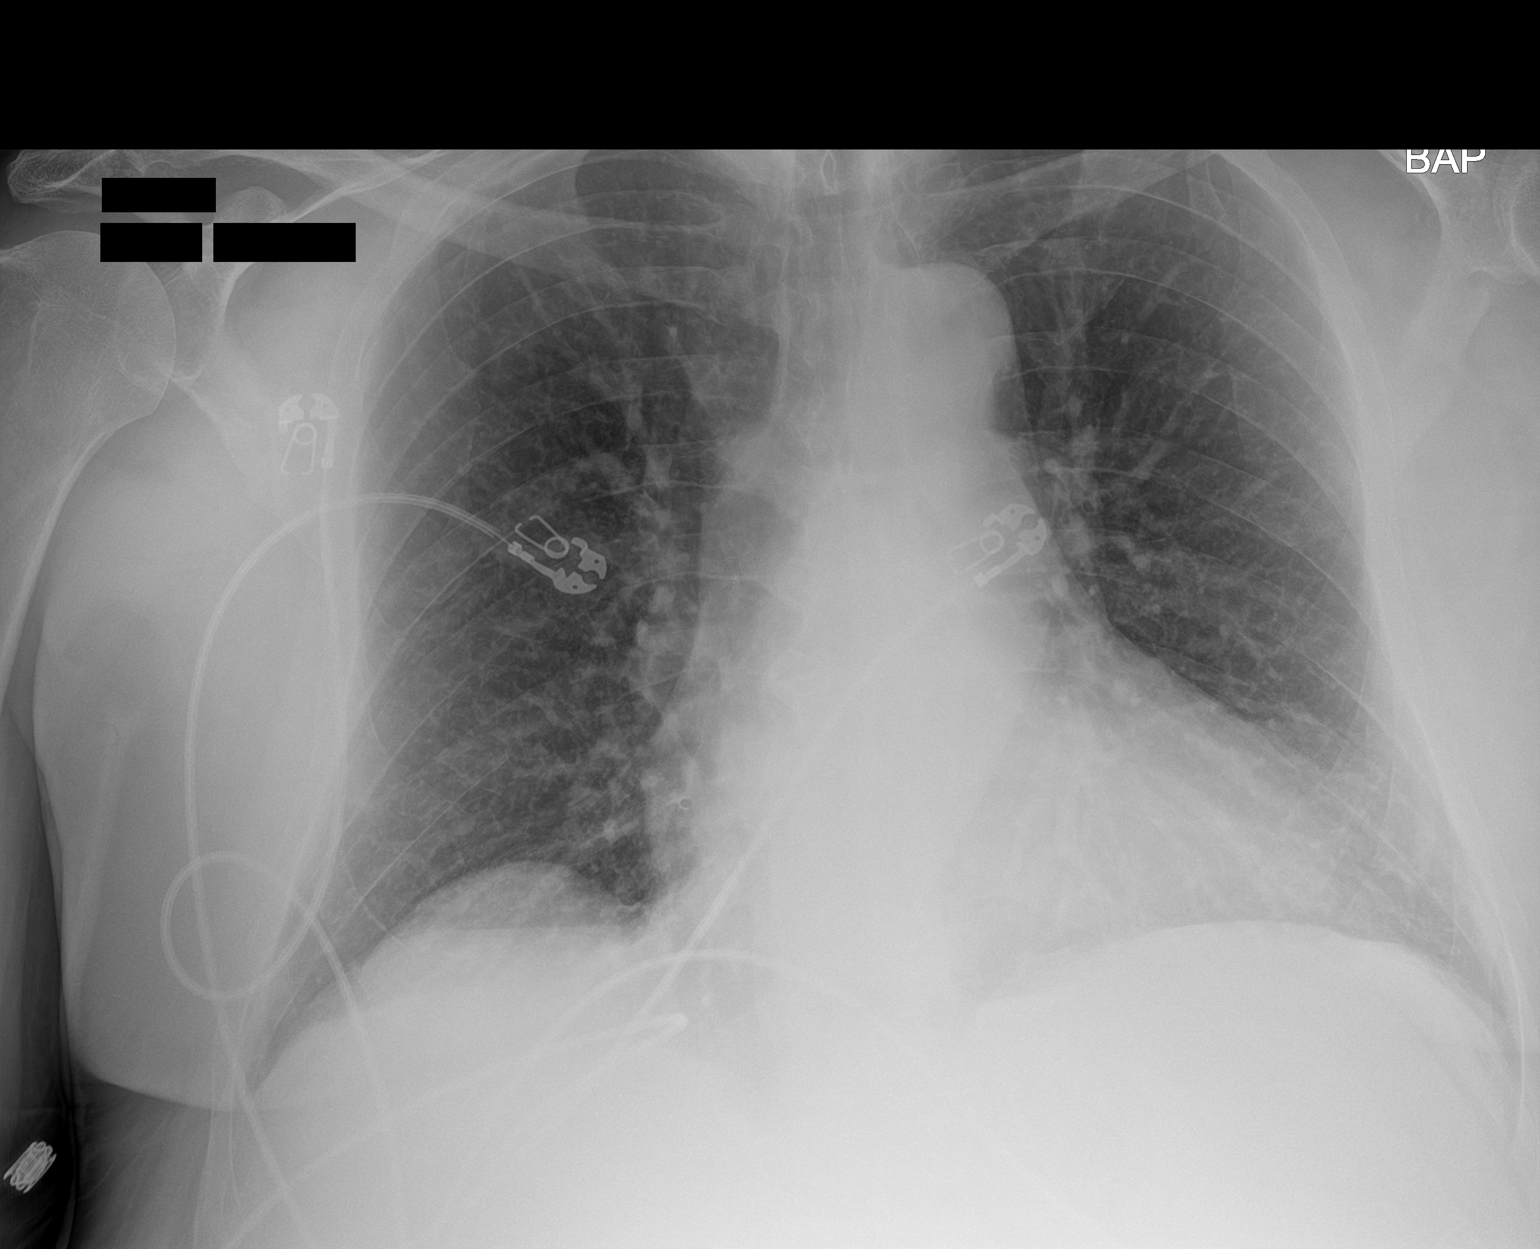

[1 of 1 positions shown; findings below may reference images not displayed]

FINDINGS: The heart size and mediastinal contours are within normal limits.
Both lungs are clear. The visualized skeletal structures are
unremarkable.
IMPRESSION: No active disease.

## 2021-05-18 NOTE — Progress Notes (Unsigned)
Cardiology Clinic Note   Patient Name: Mark Wood Date of Encounter: 05/18/2021  Primary Care Provider:  [name], MD Primary Cardiologist:  None  Patient Profile    ***  Past Medical History    Past Medical History:  Diagnosis Date   Arthritis    COPD (chronic obstructive pulmonary disease) (HCC)    Diabetes mellitus without complication (HCC)    Fibromyalgia    Venous insufficiency    No past surgical history on file.  Allergies  No Known Allergies  History of Present Illness    ***  Home Medications    Prior to Admission medications   Medication Sig Start Date End Date Taking? Authorizing Provider  acyclovir (ZOVIRAX) 400 MG tablet Take 400 mg by mouth daily.    [provider]  apixaban (ELIQUIS) 5 MG TABS tablet Take 1 tablet (5 mg total) by mouth 2 (two) times daily. 09/29/18 01/10/20  Newman Nip, NP  busPIRone (BUSPAR) 10 MG tablet Take 10 mg by mouth 2 (two) times daily.    [provider]  diazepam (VALIUM) 2 MG tablet As needed 01/01/20   [provider]  furosemide (LASIX) 80 MG tablet Take 80 mg by mouth daily.    [provider]  insulin glargine (LANTUS) 100 UNIT/ML injection Inject 20 Units into the skin daily.     [provider]  methadone (DOLOPHINE) 10 MG tablet Take 20 mg by mouth every 8 (eight) hours as needed for moderate pain.     [provider]  potassium chloride SA (K-DUR) 20 MEQ tablet Take 20 mEq by mouth daily.    [provider]  tamsulosin (FLOMAX) 0.4 MG CAPS capsule Take 0.4 mg by mouth daily after supper.    [provider]  TESTOSTERONE IM Inject 2 mg into the muscle every 14 (fourteen) days.    [provider]    Family History    Family History  Problem Relation Age of Onset   CAD Father    She indicated that her father is deceased.  Social History    Social History   Socioeconomic History   Marital status: Married     Spouse name: Not on file   Number of children: Not on file   Years of education: Not on file   Highest education level: Not on file  Occupational History   Not on file  Tobacco Use   Smoking status: Former    Types: Cigarettes    Quit date: 10/07/2006    Years since quitting: 14.6   Smokeless tobacco: Never  Substance and Sexual Activity   Alcohol use: No    Alcohol/week: 0.0 standard drinks   Drug use: Not on file   Sexual activity: Not on file  Other Topics Concern   Not on file  Social History Narrative   Not on file   Social Determinants of Health   Financial Resource Strain: Not on file  Food Insecurity: Not on file  Transportation Needs: Not on file  Physical Activity: Not on file  Stress: Not on file  Social Connections: Not on file  Intimate Partner Violence: Not on file     Review of Systems    General:  No chills, fever, night sweats or weight changes.  Cardiovascular:  No chest pain, dyspnea on exertion, edema, orthopnea, palpitations, paroxysmal nocturnal dyspnea. Dermatological: No rash, lesions/masses Respiratory: No cough, dyspnea Urologic: No hematuria, dysuria Abdominal:   No nausea, vomiting, diarrhea, bright  red blood per rectum, melena, or hematemesis Neurologic:  No visual changes, wkns, changes in mental status. All other systems reviewed and are otherwise negative except as noted above.  Physical Exam    VS:  There were no vitals taken for this visit. , BMI There is no height or weight on file to calculate BMI. GEN: Well nourished, well developed, in no acute distress. HEENT: normal. Neck: Supple, no JVD, carotid bruits, or masses. Cardiac: RRR, no murmurs, rubs, or gallops. No clubbing, cyanosis, edema.  Radials/DP/PT 2+ and equal bilaterally.  Respiratory:  Respirations regular and unlabored, clear to auscultation bilaterally. GI: Soft, nontender, nondistended, BS + x 4. MS: no deformity or atrophy. Skin: warm and dry, no rash. Neuro:   Strength and sensation are intact. Psych: Normal affect.  Accessory Clinical Findings    Recent Labs: No results found for requested labs within last 8760 hours.   Recent Lipid Panel    Component Value Date/Time   CHOL 116 11/22/2019 0915   TRIG 97 11/22/2019 0915   HDL 33 (L) 11/22/2019 0915   CHOLHDL 3.5 11/22/2019 0915   LDLCALC 64 11/22/2019 0915    ECG personally reviewed by me today- *** - No acute changes  Assessment & Plan   1.  ***   Thomasene Ripple. Americus Scheurich NP-C    05/18/2021, 7:06 AM [facility] [address] Office [phone] Fax [phone]  Notice: This dictation was prepared with Dragon dictation along with smaller phrase technology. Any transcriptional errors that result from this process are unintentional and may not be corrected upon review.  I spent***minutes examining this patient, reviewing medications, and using patient centered shared decision making involving her cardiac care.  Prior to her visit I spent greater than 20 minutes reviewing her past medical history,  medications, and prior cardiac tests.

## 2021-05-21 ENCOUNTER — Ambulatory Visit: Payer: Medicare Other | Admitting: General Practice

## 2021-05-28 ENCOUNTER — Encounter: Payer: Self-pay | Admitting: General Practice

## 2021-08-03 ENCOUNTER — Encounter (HOSPITAL_COMMUNITY): Payer: Self-pay

## 2021-08-03 ENCOUNTER — Emergency Department (HOSPITAL_COMMUNITY)
Admission: EM | Admit: 2021-08-03 | Discharge: 2021-08-03 | Disposition: A | Discharge status: Home / Self Care | Payer: Medicare Other | Attending: Emergency Medicine | Admitting: Emergency Medicine

## 2021-08-03 ENCOUNTER — Other Ambulatory Visit: Payer: Self-pay

## 2021-08-03 DIAGNOSIS — Z7901 Long term (current) use of anticoagulants: Secondary | ICD-10-CM | POA: Diagnosis not present

## 2021-08-03 DIAGNOSIS — I83891 Varicose veins of right lower extremities with other complications: Secondary | ICD-10-CM | POA: Diagnosis present

## 2021-08-03 DIAGNOSIS — I83899 Varicose veins of unspecified lower extremities with other complications: Secondary | ICD-10-CM

## 2021-08-03 DIAGNOSIS — I482 Chronic atrial fibrillation, unspecified: Secondary | ICD-10-CM

## 2021-08-03 NOTE — Discharge Instructions (Signed)
It was our pleasure to provide your ER care today - we hope that you feel better.  Keep area very clean, avoid trauma to area.   Follow up with your doctor the coming week for recheck - two sutures were placed, have those sutures removed then.   If recurrent issues with varicose veins, follow up with vein specialist.  If bleeding recurs, hold direct pressure on the bleeding spot without letting up for 10-15 minutes - if bleeding persistent, return for recheck.   Return to ER if worse, new symptoms, persistent/recurrent bleeding or other concern.

## 2021-08-03 NOTE — ED Provider Notes (Signed)
[facility] EMERGENCY DEPARTMENT Provider Note   CSN: [id number] Arrival date & time: 08/03/21  1247     History {Add pertinent medical, surgical, social history, OB history to HPI:1} Chief Complaint  Patient presents with   Varicose Veins    Mark Wood is a 76 y.o. adult.  Patient with bleeding varicose vein to right lower leg at ankle. States occurred 4 days ago, stopped, then recurred today. Is on eliquis for hx afib. No other abnormal bruising or bleeding. Denies faintness or dizziness. No sob.   The history is provided by the patient and medical records.      Home Medications Prior to Admission medications   Medication Sig Start Date End Date Taking? Authorizing Provider  acyclovir (ZOVIRAX) 400 MG tablet Take 400 mg by mouth daily.    [provider]  apixaban (ELIQUIS) 5 MG TABS tablet Take 1 tablet (5 mg total) by mouth 2 (two) times daily. 09/29/18 01/10/20  Newman Nip, NP  busPIRone (BUSPAR) 10 MG tablet Take 10 mg by mouth 2 (two) times daily.    [provider]  diazepam (VALIUM) 2 MG tablet As needed 01/01/20   [provider]  furosemide (LASIX) 80 MG tablet Take 80 mg by mouth daily.    [provider]  insulin glargine (LANTUS) 100 UNIT/ML injection Inject 20 Units into the skin daily.     [provider]  methadone (DOLOPHINE) 10 MG tablet Take 20 mg by mouth every 8 (eight) hours as needed for moderate pain.     [provider]  potassium chloride SA (K-DUR) 20 MEQ tablet Take 20 mEq by mouth daily.    [provider]  tamsulosin (FLOMAX) 0.4 MG CAPS capsule Take 0.4 mg by mouth daily after supper.    [provider]  TESTOSTERONE IM Inject 2 mg into the muscle every 14 (fourteen) days.    [provider]      Allergies    Patient has no known allergies.    Review of Systems   Review of Systems  Constitutional:  Negative for fever.  HENT:  Negative for  nosebleeds.   Gastrointestinal:  Negative for blood in stool.  Genitourinary:  Negative for hematuria.  Neurological:  Negative for syncope and light-headedness.  Hematological:        On eliquis.   Physical Exam Updated Vital Signs BP 126/73   Pulse (!) 40   Temp 98 F (36.7 C) (Oral)   Resp 14   SpO2 95%  Physical Exam Vitals and nursing note reviewed.  Constitutional:      Appearance: Normal appearance. She is well-developed.  HENT:     Head: Atraumatic.     Nose: Nose normal.     Mouth/Throat:     Mouth: Mucous membranes are moist.  Eyes:     General: No scleral icterus.    Conjunctiva/sclera: Conjunctivae normal.  Neck:     Trachea: No tracheal deviation.  Cardiovascular:     Rate and Rhythm: Normal rate.     Pulses: Normal pulses.  Pulmonary:     Effort: Pulmonary effort is normal. No accessory muscle usage or respiratory distress.  Abdominal:     General: There is no distension.     Tenderness: There is no abdominal tenderness.  Genitourinary:    Comments: No cva tenderness. Musculoskeletal:        General: No swelling.     Cervical back: Normal range of motion and neck  supple. No rigidity.     Comments: Multiple varicose veins to bilateral legs. When bandage removed from RLE, steady brisk dark/venous bleeding from varicose vein. Distal pulses palp. Foot of normal color and warmth.   Skin:    General: Skin is warm and dry.     Findings: No rash.  Neurological:     Mental Status: She is alert.     Comments: Alert, speech clear.   Psychiatric:        Mood and Affect: Mood normal.    ED Results / Procedures / Treatments   Labs (all labs ordered are listed, but only abnormal results are displayed) Labs Reviewed - No data to display  EKG None  Radiology No results found.  Procedures Procedures  {Document cardiac monitor, telemetry assessment procedure when appropriate:1}  Medications Ordered in ED Medications - No data to display  ED Course/  Medical Decision Making/ A&P                           Medical Decision Making  After pressure bandage (present on arrival) for prolonged period was carefully removed/moistened prior to removal - brisk bleeding persists.    Small 1-2 mm hole noted in vein.   Area prepped/chlorhexidine/alcohol - locally anesthetized with 2% lido 2 epi, 3-0 silk sutures x 2 placed - hemostasis achieved.   Sterile dressing.   Rec pcp/vein f/u as outpt.   Return precautinos provided.     {Document critical care time when appropriate:1} {Document review of labs and clinical decision tools ie heart score, Chads2Vasc2 etc:1}  {Document your independent review of radiology images, and any outside records:1} {Document your discussion with family members, caretakers, and with consultants:1} {Document social determinants of health affecting pt's care:1} {Document your decision making why or why not admission, treatments were needed:1} Final Clinical Impression(s) / ED Diagnoses Final diagnoses:  None    Rx / DC Orders ED Discharge Orders     None

## 2021-08-03 NOTE — ED Triage Notes (Signed)
Pt from home for eval of bleeding vericose veins on RLE. His boot caused one to burst on Thursday, had stopped by Friday when he went to his doctor, but it burst open again this morning. Patient on Eliquis.

## 2021-08-12 ENCOUNTER — Ambulatory Visit (HOSPITAL_COMMUNITY)
Admission: RE | Admit: 2021-08-12 | Discharge: 2021-08-12 | Disposition: A | Discharge status: Home / Self Care | Payer: Medicare Other | Source: Ambulatory Visit | Attending: Vascular Surgery | Admitting: Vascular Surgery

## 2021-08-12 ENCOUNTER — Encounter: Payer: Self-pay | Admitting: Vascular Surgery

## 2021-08-12 ENCOUNTER — Other Ambulatory Visit: Payer: Self-pay

## 2021-08-12 ENCOUNTER — Ambulatory Visit: Payer: Medicare Other | Admitting: Vascular Surgery

## 2021-08-12 VITALS — BP 138/72 | HR 67 | Temp 98.3°F | Resp 18 | Ht 70.0 in | Wt 222.0 lb

## 2021-08-12 DIAGNOSIS — I8391 Asymptomatic varicose veins of right lower extremity: Secondary | ICD-10-CM | POA: Diagnosis not present

## 2021-08-12 DIAGNOSIS — I8390 Asymptomatic varicose veins of unspecified lower extremity: Secondary | ICD-10-CM

## 2021-08-12 DIAGNOSIS — I872 Venous insufficiency (chronic) (peripheral): Secondary | ICD-10-CM | POA: Diagnosis not present

## 2021-08-12 NOTE — Progress Notes (Signed)
ASSESSMENT & PLAN   CHRONIC VENOUS INSUFFICIENCY: This patient has advanced venous insufficiency now with a wound on the right medial malleolus as documented in the photograph below.  This is associated with an adjacent varicose vein which is at significant risk for bleeding.  He has CEAP C6 venous disease.  I think he is at high risk for recurrent bleeding.  In the office today we carefully removed the sutures that had been placed and placed a nonadherent dressing over the wound (Xeroform), gauze for pressure, Kerlix and a 4 inch Ace.  We instructed him on how to change this daily.  I have also had a long discussion with him about the importance of leg elevation and the proper positioning for this in order to lower venous pressure.  I do not think he can safely get on compression stockings because of the large varicosities which are at risk for bleeding actually on both legs.  I encouraged him to avoid prolonged sitting and standing.  Once the wounds of healed we can try to get him more mobile.  In addition, I think he would benefit from laser ablation of the right great saphenous vein with greater than 20 stab phlebectomies in order to lower the venous pressure and lower his risk of recurrent bleeding and facilitate wound healing.   I have discussed the indications for endovenous laser ablation of the right GSV, that is to lower the pressure in the veins and potentially help relieve the symptoms from venous hypertension.  I have also discussed alternative options such as conservative treatment as described above. I have discussed the potential complications of the procedure, including bleeding, bruising, leg swelling, deep venous thrombosis (<1% risk), or failure of the vein to close <1% risk).  I have also explained that venous insufficiency is a chronic disease, and that the patient is at risk for recurrent varicose veins in the future.  All of the patient's questions were encouraged and answered. They  are agreeable to proceed.   I have discussed with the patient the indications for stab phlebectomy.  I have explained to the patient that that will have small scars from the stab incisions.  I explained that the other risks include bruising, bleeding, and phlebitis.    REASON FOR CONSULT:    Bleeding varicose veins.  The consult is requested by Dr. Claris Gower.  HPI:   Mark Wood is a 76 y.o. adult who was referred because of a bleeding episode from a varicosity in his medial right leg.  He had a significant bleeding episode over a week ago and was seen in the emergency department where a suture was placed.  He presented a week later to have the sutures removed at Dr. Arelia Sneddon office but with the dressing removed there was some persistent bleeding and a pressure dressing was reapplied.  He was sent for vascular consultation.  Of note he is on Eliquis for atrial fibrillation.  His cardiologist is Dr. Debara Pickett.  This patient denies any previous history of DVT.  He has significant back problems and his activity is fairly limited.  He does describe some aching pain and heaviness in the legs associated with sitting and standing.  He does try to elevate his legs some.  He has not been wearing compression stockings.  Past Medical History:  Diagnosis Date   Arthritis    COPD (chronic obstructive pulmonary disease) ([place])    Diabetes mellitus without complication (HCC)    Fibromyalgia  Venous insufficiency     Family History  Problem Relation Age of Onset   CAD Father     SOCIAL HISTORY: Social History   Tobacco Use   Smoking status: Former    Types: Cigarettes    Quit date: 10/07/2006    Years since quitting: 14.8   Smokeless tobacco: Never  Substance Use Topics   Alcohol use: No    Alcohol/week: 0.0 standard drinks    No Known Allergies  Current Outpatient Medications  Medication Sig Dispense Refill   acyclovir (ZOVIRAX) 400 MG tablet Take 400 mg by mouth daily.     furosemide  (LASIX) 80 MG tablet Take 80 mg by mouth daily.     insulin glargine (LANTUS) 100 UNIT/ML injection Inject 20 Units into the skin daily.      methadone (DOLOPHINE) 10 MG tablet Take 20 mg by mouth every 8 (eight) hours as needed for moderate pain.      potassium chloride SA (K-DUR) 20 MEQ tablet Take 20 mEq by mouth daily.     tamsulosin (FLOMAX) 0.4 MG CAPS capsule Take 0.4 mg by mouth daily after supper.     TESTOSTERONE IM Inject 2 mg into the muscle every 14 (fourteen) days.     apixaban (ELIQUIS) 5 MG TABS tablet Take 1 tablet (5 mg total) by mouth 2 (two) times daily. 60 tablet 11   busPIRone (BUSPAR) 10 MG tablet Take 10 mg by mouth 2 (two) times daily. (Patient not taking: Reported on 08/12/2021)     diazepam (VALIUM) 2 MG tablet As needed (Patient not taking: Reported on 08/12/2021)     No current facility-administered medications for this visit.    REVIEW OF SYSTEMS:  [X]  denotes positive finding, [ ]  denotes negative finding Cardiac  Comments:  Chest pain or chest pressure:    Shortness of breath upon exertion: x   Short of breath when lying flat:    Irregular heart rhythm:        Vascular    Pain in calf, thigh, or hip brought on by ambulation:    Pain in feet at night that wakes you up from your sleep:     Blood clot in your veins:    Leg swelling:         Pulmonary    Oxygen at home:    Productive cough:     Wheezing:         Neurologic    Sudden weakness in arms or legs:     Sudden numbness in arms or legs:     Sudden onset of difficulty speaking or slurred speech:    Temporary loss of vision in one eye:     Problems with dizziness:         Gastrointestinal    Blood in stool:     Vomited blood:         Genitourinary    Burning when urinating:     Blood in urine:        Psychiatric    Major depression:         Hematologic    Bleeding problems:    Problems with blood clotting too easily:        Skin    Rashes or ulcers:        Constitutional    Fever  or chills:    -  PHYSICAL EXAM:   Vitals:   08/12/21 1457  BP: 138/72  Pulse: 67  Resp: 18  Temp: 98.3  F (36.8 C)  TempSrc: Temporal  SpO2: 100%  Weight: 222 lb (100.7 kg)  Height: 5\' 10"  (1.778 m)   Body mass index is 31.85 kg/m. GENERAL: The patient is a well-nourished adult, in no acute distress. The vital signs are documented above. CARDIAC: There is a regular rate and rhythm.  VASCULAR: I do not detect carotid bruits. Both feet are warm and well-perfused. This patient has very advanced venous disease with large truncal varicosities in both legs which are under significant pressure.  He has significant hyperpigmentation bilaterally with lipo dermatosclerosis.  The area that bled is on the medial malleolus where the tissue was compromised and now there is an open wound with an adjacent varicosity.           PULMONARY: There is good air exchange bilaterally without wheezing or rales. ABDOMEN: Soft and non-tender with normal pitched bowel sounds.  MUSCULOSKELETAL: There are no major deformities. NEUROLOGIC: No focal weakness or paresthesias are detected. SKIN: There are no ulcers or rashes noted. PSYCHIATRIC: The patient has a normal affect.  DATA:    VENOUS DUPLEX: I have independently interpreted his venous duplex scan today.  This was of the right lower extremity only.  There was no evidence of DVT.  There was deep venous reflux in the common femoral vein and femoral vein.  On the initial report it was noted that there was reflux in the saphenofemoral junction on the right but it was difficult to visualize the right great saphenous vein because of all the large varicosities in the right thigh.  I did ultimately decide to look at the vein myself given the complexity of his anatomy.  I had signed the report after reviewing the images before performing the study myself.  On my exam, the patient did have significant superficial venous reflux in the right great  saphenous vein which was fairly pronounced.  I did have one of the vascular technologist look to confirm my findings and she did agree that there was significant reflux in the right great saphenous vein.      Deitra Mayo [facility]

## 2021-08-31 ENCOUNTER — Other Ambulatory Visit: Payer: Self-pay | Admitting: *Deleted

## 2021-08-31 DIAGNOSIS — I83013 Varicose veins of right lower extremity with ulcer of ankle: Secondary | ICD-10-CM

## 2021-09-16 ENCOUNTER — Other Ambulatory Visit: Payer: Self-pay | Admitting: *Deleted

## 2021-09-16 MED ORDER — LORAZEPAM 1 MG PO TABS: ORAL_TABLET | ORAL | 0 refills | Status: DC

## 2021-09-23 ENCOUNTER — Encounter (HOSPITAL_COMMUNITY): Payer: Medicare Other

## 2021-09-23 ENCOUNTER — Other Ambulatory Visit: Payer: Medicare Other | Admitting: Vascular Surgery

## 2021-09-30 ENCOUNTER — Encounter: Payer: Medicare Other | Admitting: Vascular Surgery

## 2021-09-30 ENCOUNTER — Ambulatory Visit: Payer: Medicare Other | Admitting: Vascular Surgery

## 2021-09-30 ENCOUNTER — Encounter (HOSPITAL_COMMUNITY): Payer: Medicare Other

## 2021-09-30 NOTE — Progress Notes (Unsigned)
Patient canceled his appointment for laser ablation.

## 2021-10-01 ENCOUNTER — Other Ambulatory Visit: Payer: Self-pay | Admitting: *Deleted

## 2021-10-01 DIAGNOSIS — I83013 Varicose veins of right lower extremity with ulcer of ankle: Secondary | ICD-10-CM

## 2021-10-07 ENCOUNTER — Encounter: Payer: Medicare Other | Admitting: Vascular Surgery

## 2021-10-07 ENCOUNTER — Encounter (HOSPITAL_COMMUNITY): Payer: Medicare Other

## 2021-11-04 ENCOUNTER — Other Ambulatory Visit: Payer: Medicare Other | Admitting: Vascular Surgery

## 2021-11-11 ENCOUNTER — Encounter (HOSPITAL_COMMUNITY): Payer: Medicare Other

## 2021-11-11 ENCOUNTER — Encounter: Payer: Medicare Other | Admitting: Vascular Surgery

## 2022-02-17 ENCOUNTER — Ambulatory Visit: Payer: No Typology Code available for payment source | Admitting: Family Medicine

## 2022-02-17 ENCOUNTER — Telehealth: Payer: Self-pay | Admitting: Family Medicine

## 2022-02-17 NOTE — Telephone Encounter (Signed)
12.13.23 no show letter sent

## 2022-02-17 NOTE — Telephone Encounter (Signed)
Pt arrived 20 minutes after appointment time and has been rescheduled. He got lost on the way here.

## 2022-02-24 ENCOUNTER — Telehealth: Payer: Self-pay | Admitting: Family Medicine

## 2022-02-24 ENCOUNTER — Ambulatory Visit: Payer: No Typology Code available for payment source | Admitting: Family Medicine

## 2022-02-24 NOTE — Telephone Encounter (Signed)
Pt called in to cancel New Patient appt with Dr. Janee Morn day of. He stated that his horses have escaped

## 2022-03-04 ENCOUNTER — Ambulatory Visit: Payer: No Typology Code available for payment source | Admitting: Nurse Practitioner

## 2022-03-10 NOTE — Telephone Encounter (Signed)
Pt was already rescheduled for 03/11/2022 - 2 no shows on new patient visit - will mark not to reschedule if pt no shows/cancels same day

## 2022-03-11 ENCOUNTER — Ambulatory Visit (INDEPENDENT_AMBULATORY_CARE_PROVIDER_SITE_OTHER): Payer: Medicare Other | Admitting: Family Medicine

## 2022-03-11 ENCOUNTER — Encounter: Payer: Self-pay | Admitting: Family Medicine

## 2022-03-11 VITALS — BP 134/84 | HR 80 | Temp 97.7°F | Ht 66.5 in | Wt 222.4 lb

## 2022-03-11 DIAGNOSIS — K219 Gastro-esophageal reflux disease without esophagitis: Secondary | ICD-10-CM

## 2022-03-11 DIAGNOSIS — I4891 Unspecified atrial fibrillation: Secondary | ICD-10-CM | POA: Diagnosis not present

## 2022-03-11 DIAGNOSIS — E1165 Type 2 diabetes mellitus with hyperglycemia: Secondary | ICD-10-CM

## 2022-03-11 DIAGNOSIS — F1190 Opioid use, unspecified, uncomplicated: Secondary | ICD-10-CM

## 2022-03-11 DIAGNOSIS — A6000 Herpesviral infection of urogenital system, unspecified: Secondary | ICD-10-CM

## 2022-03-11 DIAGNOSIS — E1151 Type 2 diabetes mellitus with diabetic peripheral angiopathy without gangrene: Secondary | ICD-10-CM

## 2022-03-11 DIAGNOSIS — N400 Enlarged prostate without lower urinary tract symptoms: Secondary | ICD-10-CM

## 2022-03-11 DIAGNOSIS — E876 Hypokalemia: Secondary | ICD-10-CM

## 2022-03-11 DIAGNOSIS — I10 Essential (primary) hypertension: Secondary | ICD-10-CM

## 2022-03-11 DIAGNOSIS — Z794 Long term (current) use of insulin: Secondary | ICD-10-CM

## 2022-03-11 DIAGNOSIS — Z23 Encounter for immunization: Secondary | ICD-10-CM

## 2022-03-11 DIAGNOSIS — I739 Peripheral vascular disease, unspecified: Secondary | ICD-10-CM | POA: Diagnosis not present

## 2022-03-11 MED ORDER — LISINOPRIL 10 MG PO TABS: 10.0000 mg | ORAL_TABLET | Freq: Every day | ORAL | 3 refills | Status: AC

## 2022-03-11 MED ORDER — POTASSIUM CHLORIDE CRYS ER 20 MEQ PO TBCR: 20.0000 meq | EXTENDED_RELEASE_TABLET | Freq: Every day | ORAL | 3 refills | Status: DC

## 2022-03-11 MED ORDER — ACYCLOVIR 400 MG PO TABS: 400.0000 mg | ORAL_TABLET | Freq: Every day | ORAL | 3 refills | Status: AC

## 2022-03-11 MED ORDER — OMEPRAZOLE 20 MG PO CPDR: 20.0000 mg | DELAYED_RELEASE_CAPSULE | Freq: Every day | ORAL | 3 refills | Status: DC

## 2022-03-11 MED ORDER — TAMSULOSIN HCL 0.4 MG PO CAPS: 0.8000 mg | ORAL_CAPSULE | Freq: Every day | ORAL | 3 refills | Status: AC

## 2022-03-11 MED ORDER — APIXABAN 5 MG PO TABS: 5.0000 mg | ORAL_TABLET | Freq: Two times a day (BID) | ORAL | 3 refills | Status: DC

## 2022-03-11 MED ORDER — FUROSEMIDE 80 MG PO TABS: 80.0000 mg | ORAL_TABLET | Freq: Every day | ORAL | 3 refills | Status: AC

## 2022-03-11 NOTE — Patient Instructions (Signed)
We refilled your medications.  

## 2022-03-11 NOTE — Progress Notes (Signed)
Assessment/Plan:   Problem List Items Addressed This Visit       Cardiovascular and Mediastinum   Atrial fibrillation [facility])    The patient is currently seeing a cardiologist (Dr. Ardine Bjork) and is managed on apixaban for atrial fibrillation prevention of thromboembolic events.  Plan: Continue current management of atrial fibrillation with apixaban as directed. The coordination with the cardiologist will continue and the patient is advised to follow up as needed or recommended by the specialist.      Relevant Medications   apixaban (ELIQUIS) 5 MG TABS tablet   furosemide (LASIX) 80 MG tablet   lisinopril (ZESTRIL) 10 MG tablet   Primary hypertension    Normotensive today.  Continue Lasix and lisinopril.  These were refilled.      Relevant Medications   apixaban (ELIQUIS) 5 MG TABS tablet   furosemide (LASIX) 80 MG tablet   lisinopril (ZESTRIL) 10 MG tablet   PAD (peripheral artery disease) ([place])    The patient has signs of poor venous circulation and has been referred to a vascular specialist (records request pending).  Plan: Await input from the vascular specialist for potential surgical intervention once diabetes control is achieved. Also, encourage ambulation and leg elevation when sitting to improve circulation as an adjunct to medical therapy.      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  Plan: The endocrinologist will manage diabetes care and medication adjustments. Follow-up with the endocrinologist as scheduled and work through prescription issues to ensure proper medication is received. Encourage blood sugar monitoring and report any hypoglycemia.      Relevant Medications   lisinopril (ZESTRIL) 10 MG tablet     Genitourinary   Benign prostatic hyperplasia - Primary    Plan: Continue management with the current regimen of tamsulosin. Address any changes in symptoms or concerns during follow-up visits.      Relevant Medications   tamsulosin (FLOMAX) 0.4 MG CAPS capsule   Genital herpes simplex    Plan: Continue suppressive therapy with acyclovir. Discuss any new outbreaks or concerns with PCP or appropriate specialist.      Relevant Medications   acyclovir (ZOVIRAX) 400 MG tablet     Other   Hypokalemia    No endorsed symptoms of hypokalemia.  Refilled potassium supplements.      Relevant Medications   potassium chloride SA (KLOR-CON M) 20 MEQ tablet   Other Visit Diagnoses     Need for viral immunization       Chronic, continuous use of opioids          Medications Discontinued During This Encounter  Medication Reason   diazepam (VALIUM) 2 MG tablet    busPIRone (BUSPAR) 10 MG tablet    LORazepam (ATIVAN) 1 MG tablet  furosemide (LASIX) 80 MG tablet Reorder   tamsulosin (FLOMAX) 0.4 MG CAPS capsule Reorder   acyclovir (ZOVIRAX) 400 MG tablet Reorder   potassium chloride SA (K-DUR) 20 MEQ tablet Reorder   apixaban (ELIQUIS) 5 MG TABS tablet Reorder   Refills of the necessary medications will be sent to the [facility] pharmacy as per the patient's account, aiming to ensure no interruption in his care.    Subjective:  HPI: Encounter date: 03/11/2022 Encounter date: 03/11/2022  CHIEF COMPLAINT: 77 year old male presenting to establish care and manage existing health  concerns.  HISTORY OF PRESENT ILLNESS:  Problem 1: The patient has a history of multiple medical conditions, including atrial fibrillation managed with apixaban, primary hypertension treated with lisinopril, and peripheral artery disease (PAD). He also reports hypokalemia and takes potassium chloride supplements.  Problem 2: The patient also has benign prostatic hyperplasia for which he takes tamsulosin and a history of genital herpes simplex which is managed with acyclovir.  Problem 3: The patient reports concerns regarding managing his diabetes, with a recent A1c of 11.1, which is significantly elevated. He is on a new regimen including Jardiance and metformin, although there were prescription fill issues he is working to resolve.  REVIEW OF SYSTEMS:  Cardiovascular: Atrial fibrillation, hypertension, PAD. Genitourinary: Benign prostatic hyperplasia, herpes simplex. Metabolic/Endocrine: Uncontrolled diabetes, hypokalemia. Gastroenterology: Gastroesophageal reflux disease managed with omeprazole. Musculoskeletal: Arthritis, with a history of leg surgery. Neurological: History of fibromyalgia.  MEDICATIONS:  The patient is currently taking:  insulin glargine (Lantus) testosterone IM injections acyclovir (Zovirax) furosemide (Lasix) potassium chloride SA (K-DUR) methadone (Dolophine) apixaban (Eliquis) tamsulosin (Flomax) omeprazole (Prilosec) lisinopril (Zestril)  Previously discontinued:  diazepam (Valium) buspirone (Buspar) lorazepam (Ativan)  The patient is not currently taking ativan, which is listed under his name.  PAST MEDICAL HISTORY: Arthritis, COPD, diabetes mellitus without complication, fibromyalgia, venous insufficiency.  PAST SURGICAL HISTORY:  Hemorrhoidectomy Leg surgery on the right leg FAMILY HISTORY: CAD in father.  SOCIAL HISTORY: The patient is a former smoker who quit in 2008, does not consume alcohol and denies illicit drug use. There is no  documented substance or sexual activity history.                                                                                               Objective:  Physical Exam: BP 134/84 (BP Location: Left Arm, Patient Position: Sitting, Cuff Size: Large)   Pulse 80   Temp 97.7 F (36.5 C) (Temporal)   Ht 5' 6.5" (1.689 m)   Wt 222 lb 6.4 oz (100.9 kg)   SpO2 91%   BMI 35.36 kg/m    General: No acute distress. Awake and conversant.  Eyes: Normal conjunctiva, anicteric. Round symmetric pupils.  ENT: Hearing grossly intact. No nasal discharge.  Neck: Neck is supple. No masses or thyromegaly.  Respiratory: Respirations are non-labored. No auditory wheezing.  Skin: Warm. No rashes or ulcers.  Psych: Alert and oriented. Cooperative, Appropriate mood and affect, Normal judgment.  CV: No cyanosis or JVD, significant cracking of the skin, some scattered ulcers and extensive erythema, no drainage, consistent with extensive PAD is on bilateral  lower extremities at least up to shins MSK: Normal ambulation. No clubbing  Neuro: Sensation and CN II-XII grossly normal.        Alesia Banda, MD, MS

## 2022-03-16 DIAGNOSIS — N400 Enlarged prostate without lower urinary tract symptoms: Secondary | ICD-10-CM | POA: Insufficient documentation

## 2022-03-16 DIAGNOSIS — I739 Peripheral vascular disease, unspecified: Secondary | ICD-10-CM | POA: Insufficient documentation

## 2022-03-16 DIAGNOSIS — E1151 Type 2 diabetes mellitus with diabetic peripheral angiopathy without gangrene: Secondary | ICD-10-CM | POA: Insufficient documentation

## 2022-03-16 DIAGNOSIS — A6000 Herpesviral infection of urogenital system, unspecified: Secondary | ICD-10-CM | POA: Insufficient documentation

## 2022-03-16 DIAGNOSIS — E876 Hypokalemia: Secondary | ICD-10-CM | POA: Insufficient documentation

## 2022-03-16 DIAGNOSIS — E119 Type 2 diabetes mellitus without complications: Secondary | ICD-10-CM | POA: Insufficient documentation

## 2022-03-16 DIAGNOSIS — I4891 Unspecified atrial fibrillation: Secondary | ICD-10-CM | POA: Insufficient documentation

## 2022-03-16 DIAGNOSIS — K219 Gastro-esophageal reflux disease without esophagitis: Secondary | ICD-10-CM | POA: Insufficient documentation

## 2022-03-16 DIAGNOSIS — I10 Essential (primary) hypertension: Secondary | ICD-10-CM | POA: Insufficient documentation

## 2022-03-16 NOTE — Assessment & Plan Note (Signed)
The patient is currently seeing a cardiologist (Dr. Ardine Bjork) and is managed on apixaban for atrial fibrillation prevention of thromboembolic events.  Plan: Continue current management of atrial fibrillation with apixaban as directed. The coordination with the cardiologist will continue and the patient is advised to follow up as needed or recommended by the specialist.

## 2022-03-16 NOTE — Assessment & Plan Note (Signed)
Plan: Continue omeprazole 20 mg.,  Medication refilled

## 2022-03-16 NOTE — Assessment & Plan Note (Signed)
The patient has signs of poor venous circulation and has been referred to a vascular specialist (records request pending).  Plan: Await input from the vascular specialist for potential surgical intervention once diabetes control is achieved. Also, encourage ambulation and leg elevation when sitting to improve circulation as an adjunct to medical therapy.

## 2022-03-16 NOTE — Assessment & Plan Note (Signed)
Plan: Continue suppressive therapy with acyclovir. Discuss any new outbreaks or concerns with PCP or appropriate specialist.

## 2022-03-16 NOTE — Assessment & Plan Note (Signed)
Normotensive today.  Continue Lasix and lisinopril.  These were refilled.

## 2022-03-16 NOTE — Assessment & Plan Note (Signed)
The patient has an elevated A1c and has had recent adjustments to his diabetes medications, adding Jardiance and metformin to his regimen.  Plan: The endocrinologist will manage diabetes care and medication adjustments. Follow-up with the endocrinologist as scheduled and work through prescription issues to ensure proper medication is received. Encourage blood sugar monitoring and report any hypoglycemia.

## 2022-03-16 NOTE — Assessment & Plan Note (Signed)
No endorsed symptoms of hypokalemia.  Refilled potassium supplements.

## 2022-03-16 NOTE — Assessment & Plan Note (Signed)
Plan: Continue management with the current regimen of tamsulosin. Address any changes in symptoms or concerns during follow-up visits.

## 2022-04-26 ENCOUNTER — Telehealth: Payer: Self-pay | Admitting: Family Medicine

## 2022-04-26 NOTE — Telephone Encounter (Signed)
Pt states that he had this completed last month before changing to Dr. Grandville Silos. He said it was done at Dr. Arelia Sneddon office.

## 2022-04-26 NOTE — Telephone Encounter (Signed)
Called patient to schedule Medicare Annual Wellness Visit (AWV). Left message for patient to call back and schedule Medicare Annual Wellness Visit (AWV).  Last date of [id number] 12/06/09 per [place]   Please schedule an appointment at any time with [facility]  If any questions, please contact me at [phone].  Thank you ,  Barkley Boards AWV direct phone # [phone]

## 2022-05-11 ENCOUNTER — Telehealth: Payer: Self-pay | Admitting: Family Medicine

## 2022-05-13 NOTE — Telephone Encounter (Signed)
error 

## 2022-05-27 LAB — HEMOGLOBIN A1C: Hemoglobin-A1c: 8.3

## 2022-06-10 ENCOUNTER — Ambulatory Visit: Payer: No Typology Code available for payment source | Admitting: Family Medicine

## 2022-06-17 ENCOUNTER — Telehealth: Payer: Self-pay | Admitting: Family Medicine

## 2022-06-17 ENCOUNTER — Ambulatory Visit: Payer: No Typology Code available for payment source | Admitting: Family Medicine

## 2022-06-17 NOTE — Telephone Encounter (Signed)
02/17/2022 no show 02/24/2022 no show 06/17/2022 same day cancel/family emergency  Do you want to charge fee due to family emergency?  Do you want to dismissal or give another chance due to situation?  Pt has already been rescheduled for 07/05/2022

## 2022-06-17 NOTE — Telephone Encounter (Signed)
Pt NS 4/11 said it's a family matter. Aware of the 50.00 service fee

## 2022-06-17 NOTE — Telephone Encounter (Signed)
FYI

## 2022-07-05 ENCOUNTER — Encounter: Payer: Self-pay | Admitting: Family Medicine

## 2022-07-05 ENCOUNTER — Ambulatory Visit (INDEPENDENT_AMBULATORY_CARE_PROVIDER_SITE_OTHER): Payer: No Typology Code available for payment source | Admitting: Family Medicine

## 2022-07-05 VITALS — BP 122/60 | HR 87 | Temp 95.3°F | Ht 67.0 in | Wt 220.8 lb

## 2022-07-05 DIAGNOSIS — I4891 Unspecified atrial fibrillation: Secondary | ICD-10-CM

## 2022-07-05 DIAGNOSIS — E1151 Type 2 diabetes mellitus with diabetic peripheral angiopathy without gangrene: Secondary | ICD-10-CM

## 2022-07-05 DIAGNOSIS — F419 Anxiety disorder, unspecified: Secondary | ICD-10-CM

## 2022-07-05 DIAGNOSIS — I739 Peripheral vascular disease, unspecified: Secondary | ICD-10-CM

## 2022-07-05 DIAGNOSIS — H16139 Photokeratitis, unspecified eye: Secondary | ICD-10-CM

## 2022-07-05 DIAGNOSIS — Z77098 Contact with and (suspected) exposure to other hazardous, chiefly nonmedicinal, chemicals: Secondary | ICD-10-CM

## 2022-07-05 DIAGNOSIS — E1165 Type 2 diabetes mellitus with hyperglycemia: Secondary | ICD-10-CM

## 2022-07-05 DIAGNOSIS — R21 Rash and other nonspecific skin eruption: Secondary | ICD-10-CM

## 2022-07-05 DIAGNOSIS — Z794 Long term (current) use of insulin: Secondary | ICD-10-CM

## 2022-07-05 DIAGNOSIS — E785 Hyperlipidemia, unspecified: Secondary | ICD-10-CM | POA: Insufficient documentation

## 2022-07-05 MED ORDER — APIXABAN 5 MG PO TABS
5.0000 mg | ORAL_TABLET | Freq: Two times a day (BID) | ORAL | 0 refills | Status: DC
Start: 2022-07-05 — End: 2022-07-05

## 2022-07-05 MED ORDER — HYDROXYZINE HCL 10 MG PO TABS
10.0000 mg | ORAL_TABLET | Freq: Three times a day (TID) | ORAL | 0 refills | Status: DC | PRN
Start: 2022-07-05 — End: 2022-08-25

## 2022-07-05 MED ORDER — APIXABAN 5 MG PO TABS
5.0000 mg | ORAL_TABLET | Freq: Two times a day (BID) | ORAL | 3 refills | Status: DC
Start: 2022-07-05 — End: 2022-07-05

## 2022-07-05 MED ORDER — APIXABAN 5 MG PO TABS
5.0000 mg | ORAL_TABLET | Freq: Two times a day (BID) | ORAL | 3 refills | Status: DC
Start: 2022-07-05 — End: 2023-10-31

## 2022-07-05 MED ORDER — ROSUVASTATIN CALCIUM 10 MG PO TABS
10.0000 mg | ORAL_TABLET | Freq: Every day | ORAL | 3 refills | Status: AC
Start: 2022-07-05 — End: ?

## 2022-07-05 NOTE — Progress Notes (Signed)
Assessment/Plan:   Problem List Items Addressed This Visit       Cardiovascular and Mediastinum   Atrial fibrillation (HCC)    The patient is stable on apixaban; continue current dosage.  Discussed cardiology referral for comprehensive cardiovascular assessment before potential vascular surgery, patient declined today but said that he would consider it in the future.  Plan: Continue apixaban 5 mg, twice daily.       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  Plan: Continue to follow with Endocrinology Continue insulin Glargine 33 units daily. Continue Metformin 500 mg twice daily. Continue Empagliflozin 10 mg daily. Recommending getting restratification labs today, however patient declined stating that he had them performed at an outside clinic.  We will request records.      Relevant Medications   rosuvastatin (CRESTOR) 10 MG tablet   insulin glargine-yfgn (SEMGLEE) 100 UNIT/ML Pen   metFORMIN (GLUCOPHAGE-XR) 500 MG 24 hr tablet   empagliflozin (JARDIANCE) 10 MG TABS tablet   Other Relevant Orders   AMB Referral to Chronic Care Management Services     Musculoskeletal and Integument   Rash and nonspecific skin eruption    Possibly contact dermatitis from  compression stockings.  Plan: Discontinue use of the current brand of compression stockings. Continue hydrocortisone cream as needed for rash relief. Referral to dermatology for assessment and potential biopsy of skin lesions. Educate on infection risks related to peripheral vascular disease and diabetes.        Other   Hyperlipidemia    For atherosclerotic cardiovascular disease prevention in diabetes.  Plan: Initiate rosuvastatin (Crestor) 10 mg daily. Monitor lipid panel on follow-up.      Relevant Medications   rosuvastatin (CRESTOR) 10 MG tablet   apixaban (ELIQUIS) 5 MG TABS tablet   Anxiety    Managed with hydroxyzine for PTSD-related symptoms.  Plan: Continue hydroxyzine (Atarax) 10 mg tablet, take half a tablet (5 mg) twice daily prn.      Relevant Medications   hydrOXYzine (ATARAX) 10 MG tablet   Agent orange exposure   Actinic keratitis - Primary    Refer to dermatology for evaluation and possible treatment of actinic keratosis and other skin lesions.      Relevant Orders   Ambulatory referral to Dermatology    Medications Discontinued During This Encounter  Medication Reason   apixaban (ELIQUIS) 5 MG TABS tablet Reorder   insulin glargine (LANTUS) 100 UNIT/ML injection    apixaban (ELIQUIS) 5 MG TABS tablet Reorder   apixaban (ELIQUIS) 5 MG TABS tablet Reorder    Return in about 6 months (around 01/04/2023) for HLD, BP.    Subjective:   Encounter date: 07/05/2022  Mark Wood is a 77 y.o. 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   He  has a past  medical history of Arthritis, COPD (chronic obstructive pulmonary disease) (HCC), Diabetes mellitus without complication (HCC), Fibromyalgia, and Venous insufficiency..   CHIEF COMPLAINT: Follow-up for chronic condition management and rash on lower extremities.  HISTORY OF PRESENT ILLNESS:  Apixaban refill: The patient reports the need for a refill of apixaban for atrial fibrillation management.  Endocrinology Follow-up: The patient confirms a recent appointment with endocrinology for diabetes management and has seen improvement in glycemic control with A1c reduction from 15 to 8.  Compression Stockings: The patient reports a reaction to wearing black support hose, resulting in a rash and itchy red bumps on the legs. He tried using different hose after washing them several times, but the reaction persisted. There is a suspected latex and/or nylon allergy contributing to the rash from compression stockings, which contain partly latex and nylon.  Peripheral Artery and Vascular Disease: The patient has peripheral artery and vascular disease and has been advised to consult a vein specialist for potential surgery, pending improved glycemic control.  There has been difficulty coordinating care there is a [place].  Review of Systems  Cardiovascular:  Negative for chest pain and palpitations.  Skin:  Positive for itching and rash.  Psychiatric/Behavioral:  The patient is nervous/anxious.   All other systems reviewed and are negative.     03/11/2022    4:07 PM 02/07/2015    3:57 PM  Depression screen PHQ 2/9  Decreased Interest 2 3  Down, Depressed, Hopeless 2 1  PHQ - 2 Score 4 4  Altered sleeping 2 3  Tired, decreased energy 2 3  Change in appetite 2   Feeling bad or failure about yourself  0 0  Trouble concentrating 1 1  Moving slowly or fidgety/restless 1 3  Suicidal thoughts 0 0  PHQ-9 Score 12 14  Difficult doing work/chores Somewhat difficult Very difficult      03/11/2022    4:07 PM   GAD 7 : Generalized Anxiety Score  Nervous, Anxious, on Edge 2  Control/stop worrying 1  Worry too much - different things 1  Trouble relaxing 2  Restless 1  Easily annoyed or irritable 2  Afraid - awful might happen 3  Total GAD 7 Score 12  Anxiety Difficulty Somewhat difficult     Past Surgical History:  Procedure Laterality Date   HEMORROIDECTOMY     LEG SURGERY Right     Outpatient Medications Prior to Visit  Medication Sig Dispense Refill   acyclovir (ZOVIRAX) 400 MG tablet Take 1 tablet (400 mg total) by mouth daily. 90 tablet 3   empagliflozin (JARDIANCE) 10 MG TABS tablet Take 10 mg by mouth daily.     furosemide (LASIX) 80 MG tablet Take 1 tablet (80 mg total) by mouth daily. 90 tablet 3   insulin glargine-yfgn (SEMGLEE) 100 UNIT/ML Pen Inject 33 Units into the skin daily.     lisinopril (ZESTRIL) 10 MG tablet Take 1 tablet (10 mg total) by mouth daily. 90 tablet 3   metFORMIN (GLUCOPHAGE-XR) 500 MG 24 hr tablet Take 500 mg by mouth 2 (two) times daily with a meal.     methadone (DOLOPHINE) 10 MG tablet Take 20 mg  by mouth every 8 (eight) hours as needed for moderate pain.      omeprazole (PRILOSEC) 20 MG capsule Take 1 capsule (20 mg total) by mouth daily. 30 capsule 3   potassium chloride SA (KLOR-CON M) 20 MEQ tablet Take 1 tablet (20 mEq total) by mouth daily. 90 tablet 3   tamsulosin (FLOMAX) 0.4 MG CAPS capsule Take 2 capsules (0.8 mg total) by mouth daily. 180 capsule 3   TESTOSTERONE IM Inject 2 mg into the muscle every 14 (fourteen) days.     apixaban (ELIQUIS) 5 MG TABS tablet Take 1 tablet (5 mg total) by mouth 2 (two) times daily. 180 tablet 3   insulin glargine (LANTUS) 100 UNIT/ML injection Inject 20 Units into the skin daily.      No facility-administered medications prior to visit.    Family History  Problem Relation Age of Onset   CAD Father     Social History   Socioeconomic History   Marital status: Married    Spouse name: Not on file    Number of children: Not on file   Years of education: Not on file   Highest education level: Not on file  Occupational History   Not on file  Tobacco Use   Smoking status: Former    Types: Cigarettes    Quit date: 10/07/2006    Years since quitting: 15.7    Passive exposure: Never   Smokeless tobacco: Never  Vaping Use   Vaping Use: Never used  Substance and Sexual Activity   Alcohol use: No    Alcohol/week: 0.0 standard drinks of alcohol   Drug use: Never   Sexual activity: Not on file  Other Topics Concern   Not on file  Social History Narrative   Not on file   Social Determinants of Health   Financial Resource Strain: Not on file  Food Insecurity: Not on file  Transportation Needs: Not on file  Physical Activity: Not on file  Stress: Not on file  Social Connections: Not on file  Intimate Partner Violence: Not on file                                                                                                  Objective:  Physical Exam: BP 122/60 (BP Location: Left Arm, Patient Position: Sitting, Cuff Size: Large)   Pulse 87   Temp (!) 95.3 F (35.2 C) (Temporal)   Ht 5\' 7"  (1.702 m)   Wt 220 lb 12.8 oz (100.2 kg)   SpO2 98%   BMI 34.58 kg/m     Physical Exam Constitutional:      Appearance: Normal appearance.  HENT:     Head: Normocephalic and atraumatic.     Right Ear: Hearing normal.     Left Ear: Hearing normal.     Nose: Nose normal.  Eyes:     General: No scleral icterus.       Right eye: No discharge.        Left eye: No discharge.     Extraocular Movements: Extraocular movements intact.  Cardiovascular:  Rate and Rhythm: Normal rate. Rhythm irregular.     Heart sounds: Normal heart sounds.     Comments: Chronic venous insufficiency including varicosities, woody appearance of skin, scaling and dermatitis bilaterally up to knees No obvious ulcers Cool to touch bilaterally Pulmonary:     Effort: Pulmonary effort is normal.     Breath  sounds: Normal breath sounds.  Abdominal:     Palpations: Abdomen is soft.     Tenderness: There is no abdominal tenderness.  Skin:    Findings: Lesion (Multiple crusting lesions scattered across face and sun exposed areas such as arms ears and lower extremities) and rash present. Rash is crusting.  Neurological:     General: No focal deficit present.     Mental Status: He is alert.     Cranial Nerves: No cranial nerve deficit.  Psychiatric:        Mood and Affect: Mood normal.        Behavior: Behavior normal.        Thought Content: Thought content normal.        Judgment: Judgment normal.     No results found.  No results found for this or any previous visit (from the past 2160 hour(s)).      Garner Nash, MD, MS

## 2022-07-05 NOTE — Assessment & Plan Note (Signed)
Managed with hydroxyzine for PTSD-related symptoms.  Plan: Continue hydroxyzine (Atarax) 10 mg tablet, take half a tablet (5 mg) twice daily prn.

## 2022-07-05 NOTE — Patient Instructions (Signed)
For PTSD and anxiety, we have refilled your hydroxyzine. For atrial fibrillation, we have refilled your apixaban.  Please follow-up with your friend and when you determine a cardiologist you feel comfortable with, I can place a referral for there. For your skin lesions, we have placed a referral to [facility] dermatology.  The office will call to help schedule this. For cholesterol, we have started rosuvastatin 10 mg. Please have your other PCP have your records sent to [place].

## 2022-07-05 NOTE — Assessment & Plan Note (Signed)
Possibly contact dermatitis from compression stockings.  Plan: Discontinue use of the current brand of compression stockings. Continue hydrocortisone cream as needed for rash relief. Referral to dermatology for assessment and potential biopsy of skin lesions. Educate on infection risks related to peripheral vascular disease and diabetes.

## 2022-07-05 NOTE — Assessment & Plan Note (Signed)
Refer to dermatology for evaluation and possible treatment of actinic keratosis and other skin lesions.

## 2022-07-05 NOTE — Assessment & Plan Note (Signed)
For atherosclerotic cardiovascular disease prevention in diabetes.  Plan: Initiate rosuvastatin (Crestor) 10 mg daily. Monitor lipid panel on follow-up.

## 2022-07-05 NOTE — Assessment & Plan Note (Signed)
The patient is stable on apixaban; continue current dosage.  Discussed cardiology referral for comprehensive cardiovascular assessment before potential vascular surgery, patient declined today but said that he would consider it in the future.  Plan: Continue apixaban 5 mg, twice daily.

## 2022-07-05 NOTE — Assessment & Plan Note (Addendum)
Follows with [facility]  Improved control from 15.3 to 8.1 A1c.  Plan: Continue to follow with Endocrinology Continue insulin Glargine 33 units daily. Continue Metformin 500 mg twice daily. Continue Empagliflozin 10 mg daily. Recommending getting restratification labs today, however patient declined stating that he had them performed at an outside clinic.  We will request records.

## 2022-07-12 ENCOUNTER — Telehealth: Payer: Self-pay

## 2022-07-12 NOTE — Progress Notes (Signed)
  Chronic Care Management   Note  07/12/2022 Name: Mark Wood MRN: [id number] DOB: 03/13/1945  Mark Wood is a 77 y.o. year old male who is a primary care patient of Garnette Gunner, MD. I reached out to Mark Wood by phone today in response to a referral sent by Mark Wood's PCP.  The first contact attempt was unsuccessful.   Follow up plan: Additional outreach attempts will be made.  Penne Lash, RMA Care Guide [facility]  [place], [place] [address] Direct Dial: [phone] [email or web address]

## 2022-07-12 NOTE — Progress Notes (Signed)
  Chronic Care Management   Note  07/12/2022 Name: Mark Wood MRN: [id number] DOB: 1945/03/31  Mark Wood is a 77 y.o. year old male who is a primary care patient of Garnette Gunner, MD. I reached out to Arville Lime by phone today in response to a referral sent by Mark Wood's PCP.  Mark Wood was given information about Chronic Care Management services today including:  CCM service includes personalized support from designated clinical staff supervised by the physician, including individualized plan of care and coordination with other care providers 24/7 contact phone numbers for assistance for urgent and routine care needs. Service will only be billed when office clinical staff spend 20 minutes or more in a month to coordinate care. Only one practitioner may furnish and bill the service in a calendar month. The patient may stop CCM services at amy time (effective at the end of the month) by phone call to the office staff. The patient will be responsible for cost sharing (co-pay) or up to 20% of the service fee (after annual deductible is met)  Mark Wood  declinedto scheduling an appointment with the CCM RN Case Manager   Follow up plan: Patient did not agree to scheduling an appointment with the RN Case Manager. The ordering provider has been notified.   Penne Lash, RMA Care Guide [facility]  [place], [place] [address] Direct Dial: [phone] [email or web address]

## 2022-08-25 ENCOUNTER — Telehealth: Payer: Self-pay | Admitting: Family Medicine

## 2022-08-25 DIAGNOSIS — F419 Anxiety disorder, unspecified: Secondary | ICD-10-CM

## 2022-08-25 DIAGNOSIS — E876 Hypokalemia: Secondary | ICD-10-CM

## 2022-08-25 MED ORDER — POTASSIUM CHLORIDE CRYS ER 20 MEQ PO TBCR: 20.0000 meq | EXTENDED_RELEASE_TABLET | Freq: Every day | ORAL | 3 refills | Status: AC

## 2022-08-25 MED ORDER — HYDROXYZINE HCL 10 MG PO TABS
10.0000 mg | ORAL_TABLET | Freq: Three times a day (TID) | ORAL | 0 refills | Status: DC | PRN
Start: 2022-08-25 — End: 2022-12-28

## 2022-08-25 NOTE — Telephone Encounter (Signed)
Prescription Request  08/25/2022  LOV: 07/05/2022  What is the name of the medication or equipment? hydrOXYzine (ATARAX/VISTARIL) tablet 10 mg   [[id number]] , and potassium chloride SA (KLOR-CON M) 20 MEQ tablet [[id number]]   Have you contacted your pharmacy to request a refill? Yes   Which pharmacy would you like this sent to?  [facility] - [place], [place] - [address] [place] [address] Phone: [phone] Fax: [phone]    Patient notified that their request is being sent to the clinical staff for review and that they should receive a response within 2 business days.   Please advise at Mobile [phone] (mobile)

## 2022-08-25 NOTE — Telephone Encounter (Signed)
Chart supports rx. Last OV: 07/05/2022

## 2022-08-30 LAB — HM DIABETES EYE EXAM

## 2022-10-12 NOTE — Telephone Encounter (Signed)
Pt has not been sent final warning. Final warning sent out today via mail and mychart.

## 2022-12-28 ENCOUNTER — Encounter: Payer: Self-pay | Admitting: Family Medicine

## 2022-12-28 ENCOUNTER — Ambulatory Visit (INDEPENDENT_AMBULATORY_CARE_PROVIDER_SITE_OTHER): Payer: Medicare Other | Admitting: Family Medicine

## 2022-12-28 VITALS — BP 120/70 | HR 80 | Temp 97.8°F | Wt 229.0 lb

## 2022-12-28 DIAGNOSIS — E1142 Type 2 diabetes mellitus with diabetic polyneuropathy: Secondary | ICD-10-CM

## 2022-12-28 DIAGNOSIS — M542 Cervicalgia: Secondary | ICD-10-CM | POA: Diagnosis not present

## 2022-12-28 DIAGNOSIS — Z7984 Long term (current) use of oral hypoglycemic drugs: Secondary | ICD-10-CM

## 2022-12-28 DIAGNOSIS — I739 Peripheral vascular disease, unspecified: Secondary | ICD-10-CM

## 2022-12-28 DIAGNOSIS — F419 Anxiety disorder, unspecified: Secondary | ICD-10-CM | POA: Diagnosis not present

## 2022-12-28 DIAGNOSIS — I10 Essential (primary) hypertension: Secondary | ICD-10-CM

## 2022-12-28 DIAGNOSIS — G8929 Other chronic pain: Secondary | ICD-10-CM

## 2022-12-28 DIAGNOSIS — I480 Paroxysmal atrial fibrillation: Secondary | ICD-10-CM

## 2022-12-28 MED ORDER — HYDROXYZINE HCL 10 MG PO TABS
10.0000 mg | ORAL_TABLET | Freq: Three times a day (TID) | ORAL | 3 refills | Status: DC | PRN
Start: 2022-12-28 — End: 2023-09-14

## 2022-12-28 NOTE — Progress Notes (Unsigned)
Assessment/Plan:   Problem List Items Addressed This Visit       Other   Anxiety   Relevant Medications   hydrOXYzine (ATARAX) 10 MG tablet   Other Visit Diagnoses     Diabetic peripheral neuropathy associated with type 2 diabetes mellitus (HCC)    -  Primary   Relevant Medications   hydrOXYzine (ATARAX) 10 MG tablet   Other Relevant Orders   Ambulatory referral to Podiatry       Medications Discontinued During This Encounter  Medication Reason   hydrOXYzine (ATARAX) 10 MG tablet Reorder    No follow-ups on file.    Subjective:   Encounter date: 12/28/2022  Mark Wood is a 77 y.o. male who has Arthritis; Benign prostatic hyperplasia; Atrial fibrillation (HCC); Primary hypertension; PAD (peripheral artery disease) (HCC); Genital herpes simplex; Hypokalemia; Gastroesophageal reflux disease; Diabetes mellitus (HCC); Peripheral circulatory disorder associated with type 2 diabetes mellitus (HCC); Rash and nonspecific skin eruption; Hyperlipidemia; Anxiety; Agent orange exposure; and Actinic keratitis on their problem list..   He  has a past medical history of Arthritis, COPD (chronic obstructive pulmonary disease) (HCC), Diabetes mellitus without complication (HCC), Fibromyalgia, and Venous insufficiency.Marland Kitchen   He presents with chief complaint of Medical Management of Chronic Issues (6 months (around 01/04/2023) for HLD, BP. Non fasting. Refill hydroxyzine) .   HPI:   ROS  Past Surgical History:  Procedure Laterality Date   HEMORROIDECTOMY     LEG SURGERY Right     Outpatient Medications Prior to Visit  Medication Sig Dispense Refill   acyclovir (ZOVIRAX) 400 MG tablet Take 1 tablet (400 mg total) by mouth daily. 90 tablet 3   apixaban (ELIQUIS) 5 MG TABS tablet Take 1 tablet (5 mg total) by mouth 2 (two) times daily. 180 tablet 3   furosemide (LASIX) 80 MG tablet Take 1 tablet (80 mg total) by mouth daily. 90 tablet 3   insulin glargine-yfgn (SEMGLEE) 100 UNIT/ML  Pen Inject 33 Units into the skin daily.     lisinopril (ZESTRIL) 10 MG tablet Take 1 tablet (10 mg total) by mouth daily. 90 tablet 3   metFORMIN (GLUCOPHAGE-XR) 500 MG 24 hr tablet Take 500 mg by mouth 2 (two) times daily with a meal.     methadone (DOLOPHINE) 10 MG tablet Take 20 mg by mouth every 8 (eight) hours as needed for moderate pain.      omeprazole (PRILOSEC) 20 MG capsule Take 1 capsule (20 mg total) by mouth daily. 30 capsule 3   potassium chloride SA (KLOR-CON M) 20 MEQ tablet Take 1 tablet (20 mEq total) by mouth daily. 90 tablet 3   rosuvastatin (CRESTOR) 10 MG tablet Take 1 tablet (10 mg total) by mouth daily. 90 tablet 3   tamsulosin (FLOMAX) 0.4 MG CAPS capsule Take 2 capsules (0.8 mg total) by mouth daily. 180 capsule 3   TESTOSTERONE IM Inject 2 mg into the muscle every 14 (fourteen) days.     hydrOXYzine (ATARAX) 10 MG tablet Take 1 tablet (10 mg total) by mouth 3 (three) times daily as needed. 30 tablet 0   empagliflozin (JARDIANCE) 10 MG TABS tablet Take 10 mg by mouth daily. (Patient not taking: Reported on 12/28/2022)     No facility-administered medications prior to visit.    Family History  Problem Relation Age of Onset   CAD Father     Social History   Socioeconomic History   Marital status: Married    Spouse name: Not on file  Number of children: Not on file   Years of education: Not on file   Highest education level: Not on file  Occupational History   Not on file  Tobacco Use   Smoking status: Former    Current packs/day: 0.00    Types: Cigarettes    Quit date: 10/07/2006    Years since quitting: 16.2    Passive exposure: Never   Smokeless tobacco: Never  Vaping Use   Vaping status: Never Used  Substance and Sexual Activity   Alcohol use: No    Alcohol/week: 0.0 standard drinks of alcohol   Drug use: Never   Sexual activity: Not on file  Other Topics Concern   Not on file  Social History Narrative   Not on file   Social Determinants of  Health   Financial Resource Strain: Not on file  Food Insecurity: No Food Insecurity (02/25/2022)   Received from [facility], [facility]   Hunger Vital Sign    Worried About Running Out of Food in the Last Year: Never true    Ran Out of Food in the Last Year: Never true  Transportation Needs: Not on file  Physical Activity: Not on file  Stress: Not on file  Social Connections: Unknown (12/04/2021)   Received from [facility], [facility]   Social Network    Social Network: Not on file  Intimate Partner Violence: Unknown (12/04/2021)   Received from [employer], [facility]   HITS    Physically Hurt: Not on file    Insult or Talk Down To: Not on file    Threaten Physical Harm: Not on file    Scream or Curse: Not on file                                                                                                  Objective:  Physical Exam: BP 120/70 Comment: home  Pulse 80   Temp 97.8 F (36.6 C) (Temporal)   Wt 229 lb (103.9 kg)   SpO2 96%   BMI 35.87 kg/m     Physical Exam  No results found.  No results found for this or any previous visit (from the past 2160 hour(s)).      Garner Nash, MD, MS

## 2022-12-30 DIAGNOSIS — G8929 Other chronic pain: Secondary | ICD-10-CM | POA: Insufficient documentation

## 2022-12-30 DIAGNOSIS — E1142 Type 2 diabetes mellitus with diabetic polyneuropathy: Secondary | ICD-10-CM | POA: Insufficient documentation

## 2022-12-30 LAB — MICROALBUMIN / CREATININE URINE RATIO: MICROALB/CREAT RATIO: 80.7

## 2022-12-30 LAB — ESTIMATED GFR: GFR: 68

## 2022-12-30 NOTE — Assessment & Plan Note (Signed)
Noted loss of sensation in feet with thickening of nails, related to diabetes. Chronic Condition Status: Worsening Plan: Continue metformin 500 MG twice daily with meals. Continue empagliflozin 10 MG daily despite patient non-adherence. Referral to podiatry due to increased risk of limb loss. Monitor and educate on importance of daily foot inspections. Follow-Up Recommendations: Follow-up in 3 months with endocrinology and as indicated with podiatry.

## 2022-12-30 NOTE — Assessment & Plan Note (Signed)
ildly elevated BP noted during visit, generally well-controlled at home. Chronic Condition Status: Stable Plan: Continue lisinopril 10 MG tablet daily. Monitor blood pressure at home and report any significant changes. Follow-Up Recommendations: Routine follow-up in 6 months.

## 2022-12-30 NOTE — Assessment & Plan Note (Signed)
Chronic atrial fibrillation, patient is currently managed on anticoagulation therapy. Chronic Condition Status: Stable Plan: Continue apixaban 5 MG tablet twice daily for anticoagulation. Follow-up with cardiology through the [place]. Follow-Up Recommendations: Evaluate in 6 months or as necessary based on cardiology input.

## 2022-12-30 NOTE — Assessment & Plan Note (Signed)
Chronic neck pain  Chronic Condition Status: Stable Plan: Continue methadone 10 MG tablet every 8 hours as needed for moderate pain. Patient declined additional therapeutic options at this time.

## 2022-12-30 NOTE — Assessment & Plan Note (Signed)
Patient reports significant anxiety despite current medication. Chronic Condition Status: Worsening Plan: Continue hydroxyzine 10 MG tablet three times daily as needed. Follow-Up Recommendations: Follow-up in 3 months or sooner if symptoms do not improve.

## 2022-12-30 NOTE — Assessment & Plan Note (Signed)
Chronic venous insufficiency with significant varicosities and PAD Chronic Condition Status: Stable  Plan: Continue + apixaban 5 mg twice daily Continue follow-up with vascular surgery

## 2023-01-12 ENCOUNTER — Ambulatory Visit: Payer: Medicare Other | Admitting: Podiatry

## 2023-02-01 ENCOUNTER — Ambulatory Visit: Payer: Medicare Other | Admitting: Podiatry

## 2023-03-03 ENCOUNTER — Ambulatory Visit: Payer: Medicare Other | Admitting: Podiatry

## 2023-04-07 ENCOUNTER — Ambulatory Visit: Payer: Medicare Other | Admitting: Podiatry

## 2023-05-05 ENCOUNTER — Ambulatory Visit: Payer: Medicare Other | Admitting: Podiatry

## 2023-05-19 ENCOUNTER — Ambulatory Visit: Payer: Medicare Other | Admitting: Podiatry

## 2023-06-10 ENCOUNTER — Ambulatory Visit: Admitting: Podiatry

## 2023-07-05 LAB — MICROALBUMIN / CREATININE URINE RATIO: Microalb Creat Ratio: 24

## 2023-07-05 LAB — PROTEIN / CREATININE RATIO, URINE
Albumin, U: 23.1
Creatinine, Urine: 96.5

## 2023-07-05 LAB — HEMOGLOBIN A1C: Hemoglobin A1C: 10.3

## 2023-07-28 ENCOUNTER — Ambulatory Visit: Payer: No Typology Code available for payment source | Admitting: Family Medicine

## 2023-08-02 ENCOUNTER — Ambulatory Visit: Admitting: Family Medicine

## 2023-08-11 ENCOUNTER — Telehealth: Payer: Self-pay | Admitting: Family Medicine

## 2023-08-11 NOTE — Telephone Encounter (Signed)
 07/28/2023 same day cancel 08/02/2023 no show  Final warning sent via mail and mychart

## 2023-08-29 ENCOUNTER — Ambulatory Visit: Admitting: Family Medicine

## 2023-08-29 LAB — HM DIABETES EYE EXAM

## 2023-09-14 ENCOUNTER — Ambulatory Visit: Admitting: Family Medicine

## 2023-09-14 ENCOUNTER — Encounter: Payer: Self-pay | Admitting: Family Medicine

## 2023-09-14 VITALS — BP 138/56 | HR 64 | Temp 97.5°F | Ht 67.0 in | Wt 221.0 lb

## 2023-09-14 DIAGNOSIS — F419 Anxiety disorder, unspecified: Secondary | ICD-10-CM

## 2023-09-14 DIAGNOSIS — E1142 Type 2 diabetes mellitus with diabetic polyneuropathy: Secondary | ICD-10-CM

## 2023-09-14 DIAGNOSIS — F4389 Other reactions to severe stress: Secondary | ICD-10-CM

## 2023-09-14 DIAGNOSIS — E1151 Type 2 diabetes mellitus with diabetic peripheral angiopathy without gangrene: Secondary | ICD-10-CM

## 2023-09-14 DIAGNOSIS — I10 Essential (primary) hypertension: Secondary | ICD-10-CM

## 2023-09-14 DIAGNOSIS — I739 Peripheral vascular disease, unspecified: Secondary | ICD-10-CM

## 2023-09-14 DIAGNOSIS — E876 Hypokalemia: Secondary | ICD-10-CM

## 2023-09-14 DIAGNOSIS — E1165 Type 2 diabetes mellitus with hyperglycemia: Secondary | ICD-10-CM

## 2023-09-14 DIAGNOSIS — F4312 Post-traumatic stress disorder, chronic: Secondary | ICD-10-CM

## 2023-09-14 DIAGNOSIS — I480 Paroxysmal atrial fibrillation: Secondary | ICD-10-CM | POA: Diagnosis not present

## 2023-09-14 DIAGNOSIS — E782 Mixed hyperlipidemia: Secondary | ICD-10-CM

## 2023-09-14 DIAGNOSIS — K219 Gastro-esophageal reflux disease without esophagitis: Secondary | ICD-10-CM

## 2023-09-14 DIAGNOSIS — Z794 Long term (current) use of insulin: Secondary | ICD-10-CM

## 2023-09-14 MED ORDER — HYDROXYZINE HCL 10 MG PO TABS: 10.0000 mg | ORAL_TABLET | Freq: Three times a day (TID) | ORAL | 3 refills | Status: AC | PRN

## 2023-09-14 NOTE — Patient Instructions (Signed)
  VISIT SUMMARY: Mark Wood, a 78 year old male with a history of hypertension, diabetes, and other health conditions, visited for a follow-up on blood pressure management. His blood pressure is well controlled, and his diabetes management with Ozempic has shown improvement. He also discussed his anxiety and panic attacks, for which he requested a refill of hydroxyzine . His other conditions, including atrial fibrillation, GERD, and peripheral arterial disease, are stable with current medications.  YOUR PLAN: -TYPE 2 DIABETES MELLITUS: Type 2 diabetes is a condition where your body does not use insulin properly, leading to high blood sugar levels. Your A1c and blood glucose levels are improving with Ozempic, which also provides benefits for your heart, kidneys, and weight. Continue taking Ozempic as prescribed.  -DIABETIC NEUROPATHY: Diabetic neuropathy is nerve damage caused by diabetes, leading to pain and numbness. Your pain is well controlled with methadone 10 mg. Continue taking methadone as prescribed.  -HYPERTENSION: Hypertension is high blood pressure, which can lead to serious health problems. Your blood pressure is well controlled with lisinopril  and Lasix . Continue taking lisinopril  10 mg and Lasix  80 mg daily, and monitor your blood pressure regularly.  -PERIPHERAL ARTERIAL DISEASE: Peripheral arterial disease is a condition where narrowed arteries reduce blood flow to your limbs, causing leg swelling. Lasix  is effectively managing this symptom. Continue taking Lasix  80 mg daily.  -ATRIAL FIBRILLATION: Atrial fibrillation is an irregular and often rapid heart rate that can increase your risk of strokes. You are on Eliquis  for this condition. Continue taking Eliquis  as prescribed.  -ANXIETY: Anxiety is a feeling of worry or fear that can be intense. You experience anxiety and panic attacks, possibly related to PTSD or diabetes. Hydroxyzine  has been effective for you. You are prescribed  hydroxyzine  10 mg as needed, up to two to three times daily.  -GASTROESOPHAGEAL REFLUX DISEASE (GERD): GERD is a digestive disorder where stomach acid irritates the food pipe lining. Your heartburn is well controlled with omeprazole . Continue taking omeprazole  as prescribed.  -CONSTIPATION: Constipation is difficulty in emptying the bowels, often associated with hardened stool. This may be due to Ozempic. Use Miralax or Senna as needed for constipation.  INSTRUCTIONS: Please continue with your current medications as prescribed. Monitor your blood pressure regularly. For constipation, use Miralax or Senna as needed. Follow up with us  if you experience any new symptoms or have concerns about your health.

## 2023-09-14 NOTE — Progress Notes (Signed)
 Assessment & Plan   Assessment/Plan:    Assessment & Plan Type 2 Diabetes Mellitus A1c and blood glucose levels are improving with Ozempic. Blood glucose has decreased from over 200 mg/dL to around 819 mg/dL. Previously on Jardiance but discontinued due to side effects. Ozempic provides additional benefits including cardiovascular and renal protection, and weight management. - Continue Ozempic as prescribed  Diabetic Neuropathy Pain is well controlled with methadone 10 mg. Declined duloxetine due to current medication load. - Continue methadone 10 mg as prescribed  Hypertension Blood pressure is well controlled at 138/60 mmHg. Diastolic pressure is slightly low but overall stable. Currently on lisinopril  10 mg and Lasix  80 mg daily, which also aids in managing leg swelling due to peripheral arterial disease. - Continue lisinopril  10 mg daily - Continue Lasix  80 mg daily - Monitor blood pressure regularly  Peripheral Arterial Disease Contributes to leg swelling. Lasix  is effectively managing this symptom. - Continue Lasix  80 mg daily  Atrial Fibrillation On Eliquis  for atrial fibrillation [facility] - Continue Eliquis  as prescribed  Anxiety Experiences anxiety and panic attacks, possibly related to PTSD or diabetes. Hydroxyzine  was previously effective. Not interested in duloxetine or buspirone due to past experiences and current medication load. - Prescribe hydroxyzine  10 mg as needed, up to two to three times daily  Gastroesophageal Reflux Disease (GERD) Heartburn is well controlled with omeprazole . - Continue omeprazole  as prescribed  Constipation Likely due to Ozempic. - Use Miralax or Senna as needed for constipation      Medications Discontinued During This Encounter  Medication Reason   empagliflozin (JARDIANCE) 10 MG TABS tablet    TESTOSTERONE IM    hydrOXYzine  (ATARAX ) 10 MG tablet Reorder    Return in about 6 months (around  03/16/2024) for BP.        Subjective:   Encounter date: 09/14/2023  Mark Wood is a 78 y.o. male who has Arthritis; Benign prostatic hyperplasia; Atrial fibrillation (HCC); Primary hypertension; PAD (peripheral artery disease) (HCC); Genital herpes simplex; Hypokalemia; Gastroesophageal reflux disease; Diabetes mellitus (HCC); Peripheral circulatory disorder associated with type 2 diabetes mellitus (HCC); Rash and nonspecific skin eruption; Hyperlipidemia; Anxiety; Agent orange exposure; Actinic keratitis; Chronic neck pain; Diabetic peripheral neuropathy associated with type 2 diabetes mellitus (HCC); and Chronic post-traumatic stress disorder (PTSD) after military combat on their problem list..   He  has a past medical history of Arthritis, COPD (chronic obstructive pulmonary disease) (HCC), Diabetes mellitus without complication (HCC), Fibromyalgia, and Venous insufficiency.SABRA   He presents with chief complaint of Diabetes (21mo f/u; A1c been up and down; was put on ozempic by endo that's helping) .   Discussed the use of AI scribe software for clinical note transcription with the patient, who gave verbal consent to proceed.  History of Present Illness Mark Wood is a 78 year old male with hypertension and diabetes who presents for follow-up on blood pressure management.  He is currently taking lisinopril  10 mg and Lasix  80 mg daily, which also helps with leg swelling due to peripheral arterial disease. His blood pressure is well controlled, but he notes that the diastolic pressure is slightly low.  He has a history of diabetes and is currently on Ozempic, which has helped lower his A1c and blood sugar levels. His blood sugar has decreased from well over 200 to around 180 mg/dL. He was previously on Jardiance but discontinued it due to side effects.  He experiences anxiety and panic attacks, which he attributes  to his diabetes and possibly PTSD. He was previously prescribed hydroxyzine ,  which he found effective, and is seeking a refill. He takes hydroxyzine  10 mg as needed, up to two times a day. He has tried buspirone in the past but did not tolerate it well.  He also has a history of atrial fibrillation and is on Eliquis . No chest pain, shortness of breath, or dizziness. His heartburn is managed with omeprazole , and his symptoms are well-controlled.  He is currently taking methadone 10 mg for pain management, which he reports is effective. He also takes potassium 20 mg daily and rosuvastatin  10 mg for cholesterol management.  He mentions that his wife recently broke her hip, and he is her primary caregiver, which has been challenging. He has support from his children and grandchildren, who assist with transportation and other needs.       03/11/2022    4:07 PM 02/07/2015    3:57 PM  Depression screen PHQ 2/9  Decreased Interest 2 3  Down, Depressed, Hopeless 2 1  PHQ - 2 Score 4 4  Altered sleeping 2 3  Tired, decreased energy 2 3  Change in appetite 2   Feeling bad or failure about yourself  0 0  Trouble concentrating 1 1  Moving slowly or fidgety/restless 1 3  Suicidal thoughts 0 0   PHQ-9 Score 12 14  Difficult doing work/chores Somewhat difficult Very difficult     Data saved with a previous flowsheet row definition      03/11/2022    4:07 PM  GAD 7 : Generalized Anxiety Score  Nervous, Anxious, on Edge 2  Control/stop worrying 1  Worry too much - different things 1  Trouble relaxing 2  Restless 1  Easily annoyed or irritable 2  Afraid - awful might happen 3  Total GAD 7 Score 12  Anxiety Difficulty Somewhat difficult     ROS  Past Surgical History:  Procedure Laterality Date   HEMORROIDECTOMY     LEG SURGERY Right     Outpatient Medications Prior to Visit  Medication Sig Dispense Refill   apixaban  (ELIQUIS ) 5 MG TABS tablet Take 1 tablet (5 mg total) by mouth 2 (two) times daily. 180 tablet 3   furosemide  (LASIX ) 80 MG tablet Take 1 tablet  (80 mg total) by mouth daily. 90 tablet 3   insulin glargine-yfgn (SEMGLEE) 100 UNIT/ML Pen Inject 33 Units into the skin daily.     lisinopril  (ZESTRIL ) 10 MG tablet Take 1 tablet (10 mg total) by mouth daily. 90 tablet 3   metFORMIN (GLUCOPHAGE-XR) 500 MG 24 hr tablet Take 500 mg by mouth 2 (two) times daily with a meal.     methadone (DOLOPHINE) 10 MG tablet Take 20 mg by mouth every 8 (eight) hours as needed for moderate pain.      omeprazole  (PRILOSEC) 20 MG capsule Take 1 capsule (20 mg total) by mouth daily. 30 capsule 3   potassium chloride  SA (KLOR-CON  M) 20 MEQ tablet Take 1 tablet (20 mEq total) by mouth daily. 90 tablet 3   rosuvastatin  (CRESTOR ) 10 MG tablet Take 1 tablet (10 mg total) by mouth daily. 90 tablet 3   Semaglutide, 1 MG/DOSE, 4 MG/3ML SOPN Inject 1 mg into the skin once a week.     hydrOXYzine  (ATARAX ) 10 MG tablet Take 1 tablet (10 mg total) by mouth 3 (three) times daily as needed for anxiety. 270 tablet 3   TESTOSTERONE IM Inject 2 mg into the muscle  every 14 (fourteen) days.     empagliflozin (JARDIANCE) 10 MG TABS tablet Take 10 mg by mouth daily. (Patient not taking: Reported on 09/14/2023)     No facility-administered medications prior to visit.    Family History  Problem Relation Age of Onset   CAD Father     Social History   Socioeconomic History   Marital status: Married    Spouse name: Not on file   Number of children: Not on file   Years of education: Not on file   Highest education level: Not on file  Occupational History   Not on file  Tobacco Use   Smoking status: Former    Current packs/day: 0.00    Types: Cigarettes    Quit date: 10/07/2006    Years since quitting: 16.9    Passive exposure: Never   Smokeless tobacco: Never  Vaping Use   Vaping status: Never Used  Substance and Sexual Activity   Alcohol use: No    Alcohol/week: 0.0 standard drinks of alcohol   Drug use: Never   Sexual activity: Not on file  Other Topics Concern    Not on file  Social History Narrative   Not on file   Social Drivers of Health   Financial Resource Strain: Not on file  Food Insecurity: No Food Insecurity (02/25/2022)   Received from [facility]   Hunger Vital Sign    Within the past 12 months, you worried that your food would run out before you got the money to buy more.: Never true    Within the past 12 months, the food you bought just didn't last and you didn't have money to get more.: Never true  Transportation Needs: Not on file  Physical Activity: Not on file  Stress: Not on file  Social Connections: Unknown (12/04/2021)   Received from [facility]   Social Network    Social Network: Not on file  Intimate Partner Violence: Unknown (12/04/2021)   Received from [facility]   HITS    Physically Hurt: Not on file    Insult or Talk Down To: Not on file    Threaten Physical Harm: Not on file    Scream or Curse: Not on file                                                                                                  Objective:  Physical Exam: BP (!) 138/56   Pulse 64   Temp (!) 97.5 F (36.4 C)   Ht 5' 7 (1.702 m)   Wt 221 lb (100.2 kg)   SpO2 95%   BMI 34.61 kg/m    Physical Exam VITALS: BP- 138/60 GENERAL: Alert, cooperative, well developed, no acute distress HEENT: Normocephalic, normal oropharynx, moist mucous membranes CHEST: Clear to auscultation bilaterally, no wheezes, rhonchi, or crackles CARDIOVASCULAR: Normal heart rate and rhythm, S1 and S2 normal without murmurs ABDOMEN: Tender to palpation, non-distended, without organomegaly, normal bowel sounds EXTREMITIES: No cyanosis or edema NEUROLOGICAL: Cranial nerves grossly intact, moves all extremities without gross motor or sensory deficit   Physical Exam  Constitutional:      Appearance: Normal appearance.  HENT:     Head: Normocephalic and atraumatic.     Right Ear: Hearing normal.     Left Ear: Hearing normal.     Nose: Nose normal.   Eyes:     General: No scleral icterus.       Right eye: No discharge.        Left eye: No discharge.     Extraocular Movements: Extraocular movements intact.  Cardiovascular:     Rate and Rhythm: Normal rate. Rhythm irregular.     Heart sounds: Normal heart sounds.     Comments: Chronic venous insufficiency including varicosities, woody appearance of skin, scaling and dermatitis bilaterally up to knees No obvious ulcers Cool to touch bilaterally Pulmonary:     Effort: Pulmonary effort is normal.     Breath sounds: Normal breath sounds.  Abdominal:     Palpations: Abdomen is soft.     Tenderness: There is no abdominal tenderness.  Skin:    Findings: Lesion (Multiple crusting lesions scattered across face and sun exposed areas such as arms ears and lower extremities) and rash present. Rash is crusting.  Neurological:     General: No focal deficit present.     Mental Status: He is alert.     Cranial Nerves: No cranial nerve deficit.  Psychiatric:        Mood and Affect: Mood normal.        Behavior: Behavior normal.        Thought Content: Thought content normal.        Judgment: Judgment normal.     No results found.  No results found for this or any previous visit (from the past 2160 hours).      Beverley Adine Hummer, MD, MS

## 2023-09-16 ENCOUNTER — Telehealth: Payer: Self-pay

## 2023-09-16 NOTE — Telephone Encounter (Signed)
 Left voicemail asking patient to return call at [phone].  Need clarification    Copied from CRM [id number]. Topic: Clinical - Medication Question >> Sep 15, 2023  2:56 PM Abigail D wrote: Reason for CRM: Patient is requesting that his recently prescribed medication from 7/9 is mailed to his address, [facility]. He said this program is to assist with him not having to drive all the way to [place], he is wondering if the medication could be sent to him instead.

## 2023-09-19 NOTE — Telephone Encounter (Signed)
 Called pharmacy to update patient request to mail hydrOXYzine  (ATARAX ) 10 MG tablet  to patient's home.

## 2023-09-19 NOTE — Telephone Encounter (Signed)
 Pt returned your call.

## 2023-10-10 ENCOUNTER — Ambulatory Visit (INDEPENDENT_AMBULATORY_CARE_PROVIDER_SITE_OTHER)

## 2023-10-10 DIAGNOSIS — Z0000 Encounter for general adult medical examination without abnormal findings: Secondary | ICD-10-CM

## 2023-10-10 NOTE — Progress Notes (Signed)
 Subjective:   Mark Wood is a 78 y.o. who presents for a Medicare Wellness preventive visit.  As a reminder, Annual Wellness Visits don't include a physical exam, and some assessments may be limited, especially if this visit is performed virtually. We may recommend an in-person follow-up visit with your provider if needed.  Visit Complete: Virtual I connected with  Mark Wood on 10/10/23 by a audio enabled telemedicine application and verified that I am speaking with the correct person using two identifiers.  Patient Location: Home  Provider Location: Office/Clinic  I discussed the limitations of evaluation and management by telemedicine. The patient expressed understanding and agreed to proceed.  Vital Signs: Because this visit was a virtual/telehealth visit, some criteria may be missing or patient reported. Any vitals not documented were not able to be obtained and vitals that have been documented are patient reported.  VideoError- Librarian, academic were attempted between this provider and patient, however failed, due to patient having technical difficulties OR patient did not have access to video capability.  We continued and completed visit with audio only.   Persons Participating in Visit: Patient.  AWV Questionnaire: No: Patient Medicare AWV questionnaire was not completed prior to this visit.  Cardiac Risk Factors include: advanced age (>42men, >63 women);diabetes mellitus;dyslipidemia;male gender;hypertension     Objective:    Today's Vitals   10/10/23 1501  PainSc: 3    There is no height or weight on file to calculate BMI.     10/10/2023    3:10 PM 08/03/2021    5:25 PM 07/20/2018    3:22 PM 07/14/2018   11:17 AM 02/07/2015    3:56 PM  Advanced Directives  Does Patient Have a Medical Advance Directive? Yes No No No Yes   Type of Estate agent of [place];Living will    Healthcare Power of [place];Living will    Does patient want to make changes to medical advance directive?     No - Patient declined   Copy of Healthcare Power of Attorney in Chart? No - copy requested    No - copy requested   Would patient like information on creating a medical advance directive?   No - Patient declined  No - Patient declined       Data saved with a previous flowsheet row definition    Current Medications (verified) Outpatient Encounter Medications as of 10/10/2023  Medication Sig   apixaban  (ELIQUIS ) 5 MG TABS tablet Take 1 tablet (5 mg total) by mouth 2 (two) times daily.   furosemide  (LASIX ) 80 MG tablet Take 1 tablet (80 mg total) by mouth daily.   hydrOXYzine  (ATARAX ) 10 MG tablet Take 1 tablet (10 mg total) by mouth 3 (three) times daily as needed for anxiety.   insulin glargine-yfgn (SEMGLEE) 100 UNIT/ML Pen Inject 33 Units into the skin daily.   lisinopril  (ZESTRIL ) 10 MG tablet Take 1 tablet (10 mg total) by mouth daily.   metFORMIN (GLUCOPHAGE-XR) 500 MG 24 hr tablet Take 500 mg by mouth 2 (two) times daily with a meal.   methadone (DOLOPHINE) 10 MG tablet Take 20 mg by mouth every 8 (eight) hours as needed for moderate pain.    omeprazole  (PRILOSEC) 20 MG capsule Take 1 capsule (20 mg total) by mouth daily.   potassium chloride  SA (KLOR-CON  M) 20 MEQ tablet Take 1 tablet (20 mEq total) by mouth daily.   rosuvastatin  (CRESTOR ) 10 MG tablet Take 1 tablet (10 mg total)  by mouth daily.   Semaglutide, 1 MG/DOSE, 4 MG/3ML SOPN Inject 1 mg into the skin once a week.   No facility-administered encounter medications on file as of 10/10/2023.    Allergies (verified) Patient has no known allergies.   History: Past Medical History:  Diagnosis Date   Arthritis    COPD (chronic obstructive pulmonary disease) (HCC)    Diabetes mellitus without complication (HCC)    Fibromyalgia    Venous insufficiency    Past Surgical History:  Procedure Laterality Date   HEMORROIDECTOMY     LEG SURGERY Right    Family  History  Problem Relation Age of Onset   CAD Father    Social History   Socioeconomic History   Marital status: Married    Spouse name: Not on file   Number of children: Not on file   Years of education: Not on file   Highest education level: Not on file  Occupational History   Not on file  Tobacco Use   Smoking status: Former    Current packs/day: 0.00    Types: Cigarettes    Quit date: 10/07/2006    Years since quitting: 17.0    Passive exposure: Never   Smokeless tobacco: Never  Vaping Use   Vaping status: Never Used  Substance and Sexual Activity   Alcohol use: No    Alcohol/week: 0.0 standard drinks of alcohol   Drug use: Never   Sexual activity: Not on file  Other Topics Concern   Not on file  Social History Narrative   Not on file   Social Drivers of Health   Financial Resource Strain: Low Risk  (10/10/2023)   Overall Financial Resource Strain (CARDIA)    Difficulty of Paying Living Expenses: Not hard at all  Food Insecurity: No Food Insecurity (10/10/2023)   Hunger Vital Sign    Worried About Running Out of Food in the Last Year: Never true    Ran Out of Food in the Last Year: Never true  Transportation Needs: No Transportation Needs (10/10/2023)   PRAPARE - Administrator, Civil Service (Medical): No    Lack of Transportation (Non-Medical): No  Physical Activity: Sufficiently Active (10/10/2023)   Exercise Vital Sign    Days of Exercise per Week: 7 days    Minutes of Exercise per Session: 60 min  Stress: No Stress Concern Present (10/10/2023)   [employer] of Occupational Health - Occupational Stress Questionnaire    Feeling of Stress: Not at all  Social Connections: Moderately Isolated (10/10/2023)   Social Connection and Isolation Panel    Frequency of Communication with Friends and Family: More than three times a week    Frequency of Social Gatherings with Friends and Family: Three times a week    Attends Religious Services: Never    Active  Member of Clubs or Organizations: No    Attends Engineer, structural: Never    Marital Status: Married    Tobacco Counseling Counseling given: Not Answered    Clinical Intake:  Pre-visit preparation completed: Yes  Pain : 0-10 Pain Score: 3  Pain Type: Chronic pain Pain Location: Back (left hip) Pain Orientation: Lower Pain Descriptors / Indicators: Aching Pain Onset: More than a month ago Pain Frequency: Constant     Nutritional Risks: None Diabetes: Yes CBG done?: No Did pt. bring in CBG monitor from home?: No  Lab Results  Component Value Date   HGBA1C 10.3 07/05/2023   HGBA1C 8.3 05/27/2022  HGBA1C 8.2 (H) 11/22/2019     How often do you need to have someone help you when you read instructions, pamphlets, or other written materials from your doctor or pharmacy?: 1 - Never  Interpreter Needed?: No  Information entered by :: NAllen LPN   Activities of Daily Living     10/10/2023    3:04 PM  In your present state of health, do you have any difficulty performing the following activities:  Hearing? 1  Comment has hearing aid that uses sometimes  Vision? 1  Comment has an appointment at [facility]  Difficulty concentrating or making decisions? 0  Walking or climbing stairs? 1  Dressing or bathing? 0  Doing errands, shopping? 0  Preparing Food and eating ? N  Using the Toilet? N  In the past six months, have you accidently leaked urine? N  Do you have problems with loss of bowel control? N  Managing your Medications? N  Managing your Finances? N  Housekeeping or managing your Housekeeping? N    Patient Care Team: Sebastian Beverley NOVAK, MD as PCP - General (Family Medicine) Mona Vinie BROCKS, MD as PCP - Cardiology (Cardiology)  I have updated your Care Teams any recent Medical Services you may have received from other providers in the past year.     Assessment:   This is a routine wellness examination for Mark Wood.  Hearing/Vision screen Hearing  Screening - Comments:: Has hearing aid Vision Screening - Comments:: [facility]   Goals Addressed             This Visit's Progress    Patient Stated       10/10/2023, keeping sugar down and watching diet       Depression Screen     10/10/2023    3:11 PM 09/14/2023    3:32 PM 03/11/2022    4:07 PM 02/07/2015    3:57 PM  PHQ 2/9 Scores  PHQ - 2 Score 0 4 4 4   PHQ- 9 Score 0 9 12 14     Fall Risk     10/10/2023    3:11 PM 07/05/2022    3:13 PM 02/07/2015    3:57 PM  Fall Risk   Falls in the past year? 0 0 No   Number falls in past yr: 0 0   Injury with Fall? 0 0   Risk for fall due to : Medication side effect No Fall Risks   Follow up Falls evaluation completed;Falls prevention discussed Falls evaluation completed      Data saved with a previous flowsheet row definition    MEDICARE RISK AT HOME:  Medicare Risk at Home Any stairs in or around the home?: No If so, are there any without handrails?: No Home free of loose throw rugs in walkways, pet beds, electrical cords, etc?: Yes Adequate lighting in your home to reduce risk of falls?: Yes Life alert?: No Use of a cane, walker or w/c?: Yes Grab bars in the bathroom?: Yes Shower chair or bench in shower?: Yes Elevated toilet seat or a handicapped toilet?: No  TIMED UP AND GO:  Was the test performed?  No  Cognitive Function: 6CIT completed        10/10/2023    3:12 PM  6CIT Screen  What Year? 0 points  What month? 0 points  What time? 0 points  Count back from 20 0 points  Months in reverse 0 points  Repeat phrase 2 points  Total Score 2 points  Immunizations Immunization History  Administered Date(s) Administered   Influenza, High Dose Seasonal PF 12/06/2008, 11/11/2011   Influenza-Unspecified 12/06/2009, 12/07/2010, 11/06/2012, 12/10/2014, 11/07/2018, 12/06/2020, 11/08/2022   PFIZER(Purple Top)SARS-COV-2 Vaccination 03/28/2019, 04/16/2019, 11/12/2019   Pneumococcal Conjugate,unspecified  05/03/2014   Pneumococcal Conjugate-13 12/10/2014   Pneumococcal Polysaccharide-23 03/09/2007, 02/09/2013   Pneumococcal-Unspecified 11/07/2007   Td (Adult),5 Lf Tetanus Toxid, Preservative Free 01/15/2021   Tdap 12/07/2006, 12/25/2010, 11/03/2017   Zoster Recombinant(Shingrix) 05/05/2021   Zoster, Live 11/11/2011    Screening Tests Health Maintenance  Topic Date Due   Zoster Vaccines- Shingrix (2 of 2) 06/30/2021   Diabetic kidney evaluation - eGFR measurement  10/30/2022   COVID-19 Vaccine (4 - 2024-25 season) 11/07/2022   INFLUENZA VACCINE  10/07/2023   FOOT EXAM  12/28/2023   HEMOGLOBIN A1C  01/04/2024   Diabetic kidney evaluation - Urine ACR  07/04/2024   OPHTHALMOLOGY EXAM  08/28/2024   Medicare Annual Wellness (AWV)  10/09/2024   DTaP/Tdap/Td (5 - Td or Tdap) 01/16/2031   Pneumococcal Vaccine: 50+ Years  Completed   Hepatitis B Vaccines  Aged Out   HPV VACCINES  Aged Out   Meningococcal B Vaccine  Aged Out   Hepatitis C Screening  Discontinued    Health Maintenance  Health Maintenance Due  Topic Date Due   Zoster Vaccines- Shingrix (2 of 2) 06/30/2021   Diabetic kidney evaluation - eGFR measurement  10/30/2022   COVID-19 Vaccine (4 - 2024-25 season) 11/07/2022   INFLUENZA VACCINE  10/07/2023   Health Maintenance Items Addressed: Due for second shingles vaccine. Declines covid vaccine.  Additional Screening:  Vision Screening: Recommended annual ophthalmology exams for early detection of glaucoma and other disorders of the eye. Would you like a referral to an eye doctor? No    Dental Screening: Recommended annual dental exams for proper oral hygiene  Community Resource Referral / Chronic Care Management: CRR required this visit?  No   CCM required this visit?  No   Plan:    I have personally reviewed and noted the following in the patient's chart:   Medical and social history Use of alcohol, tobacco or illicit drugs  Current medications and  supplements including opioid prescriptions. Patient is not currently taking opioid prescriptions. Functional ability and status Nutritional status Physical activity Advanced directives List of other physicians Hospitalizations, surgeries, and ER visits in previous 12 months Vitals Screenings to include cognitive, depression, and falls Referrals and appointments  In addition, I have reviewed and discussed with patient certain preventive protocols, quality metrics, and best practice recommendations. A written personalized care plan for preventive services as well as general preventive health recommendations were provided to patient.   Ardella FORBES Dawn, LPN   03/12/7972   After Visit Summary: (Pick Up) Due to this being a telephonic visit, with patients personalized plan was offered to patient and patient has requested to Pick up at office.  Notes: Nothing significant to report at this time.

## 2023-10-10 NOTE — Patient Instructions (Signed)
 Mr. Mark Wood , Thank you for taking time out of your busy schedule to complete your Annual Wellness Visit with me. I enjoyed our conversation and look forward to speaking with you again next year. I, as well as your care team,  appreciate your ongoing commitment to your health goals. Please review the following plan we discussed and let me know if I can assist you in the future. Your Game plan/ To Do List    Referrals: If you haven't heard from the office you've been referred to, please reach out to them at the phone provided.   Follow up Visits: We will see or speak with you next year for your Next Medicare AWV with our clinical staff Have you seen your provider in the last 6 months (3 months if uncontrolled diabetes)? Yes  Clinician Recommendations:  Aim for 30 minutes of exercise or brisk walking, 6-8 glasses of water, and 5 servings of fruits and vegetables each day.       This is a list of the screenings recommended for you:  Health Maintenance  Topic Date Due   Zoster (Shingles) Vaccine (2 of 2) 06/30/2021   Yearly kidney function blood test for diabetes  10/30/2022   COVID-19 Vaccine (4 - 2024-25 season) 11/07/2022   Flu Shot  10/07/2023   Complete foot exam   12/28/2023   Hemoglobin A1C  01/04/2024   Yearly kidney health urinalysis for diabetes  07/04/2024   Eye exam for diabetics  08/28/2024   Medicare Annual Wellness Visit  10/09/2024   DTaP/Tdap/Td vaccine (5 - Td or Tdap) 01/16/2031   Pneumococcal Vaccine for age over 87  Completed   Hepatitis B Vaccine  Aged Out   HPV Vaccine  Aged Out   Meningitis B Vaccine  Aged Out   Hepatitis C Screening  Discontinued    Advanced directives: (Copy Requested) Please bring a copy of your health care power of attorney and living will to the office to be added to your chart at your convenience. You can mail to [facility] [address] [place], [place] [address] or email to [email or web address] Advance Care  Planning is important because it:  [x]  Makes sure you receive the medical care that is consistent with your values, goals, and preferences  [x]  It provides guidance to your family and loved ones and reduces their decisional burden about whether or not they are making the right decisions based on your wishes.  Follow the link provided in your after visit summary or read over the paperwork we have mailed to you to help you started getting your Advance Directives in place. If you need assistance in completing these, please reach out to us  so that we can help you!  See attachments for Preventive Care and Fall Prevention Tips.

## 2023-10-31 ENCOUNTER — Other Ambulatory Visit: Payer: Self-pay | Admitting: Family Medicine

## 2023-10-31 DIAGNOSIS — I4891 Unspecified atrial fibrillation: Secondary | ICD-10-CM

## 2023-11-03 ENCOUNTER — Other Ambulatory Visit: Payer: Self-pay | Admitting: Family Medicine

## 2023-11-03 DIAGNOSIS — I4891 Unspecified atrial fibrillation: Secondary | ICD-10-CM

## 2023-11-03 NOTE — Telephone Encounter (Unsigned)
 Copied from CRM [id number]. Topic: Clinical - Medication Refill >> Nov 03, 2023  4:45 PM Jayma L wrote: Medication: ELIQUIS  5 MG TABS tablet  Has the patient contacted their pharmacy? Yes (Agent: If no, request that the patient contact the pharmacy for the refill. If patient does not wish to contact the pharmacy document the reason why and proceed with request.) (Agent: If yes, when and what did the pharmacy advise?)  This is the patient's preferred pharmacy:  [facility] [place], [place] - [address] [place] [address] Phone: [phone] Fax: [phone]  Is this the correct pharmacy for this prescription? Yes If no, delete pharmacy and type the correct one.   Has the prescription been filled recently? no  Is the patient out of the medication? Yes  Has the patient been seen for an appointment in the last year OR does the patient have an upcoming appointment? Yes  Can we respond through MyChart? No  Agent: Please be advised that Rx refills may take up to 3 business days. We ask that you follow-up with your pharmacy.

## 2024-03-16 ENCOUNTER — Encounter: Payer: Self-pay | Admitting: Family Medicine

## 2024-03-16 ENCOUNTER — Ambulatory Visit: Admitting: Family Medicine

## 2024-03-16 VITALS — BP 102/64 | HR 56 | Temp 97.9°F | Ht 70.0 in | Wt 219.0 lb

## 2024-03-16 DIAGNOSIS — K219 Gastro-esophageal reflux disease without esophagitis: Secondary | ICD-10-CM

## 2024-03-16 DIAGNOSIS — Z794 Long term (current) use of insulin: Secondary | ICD-10-CM | POA: Diagnosis not present

## 2024-03-16 DIAGNOSIS — K2100 Gastro-esophageal reflux disease with esophagitis, without bleeding: Secondary | ICD-10-CM | POA: Diagnosis not present

## 2024-03-16 DIAGNOSIS — E1151 Type 2 diabetes mellitus with diabetic peripheral angiopathy without gangrene: Secondary | ICD-10-CM | POA: Diagnosis not present

## 2024-03-16 DIAGNOSIS — K449 Diaphragmatic hernia without obstruction or gangrene: Secondary | ICD-10-CM | POA: Diagnosis not present

## 2024-03-16 DIAGNOSIS — L308 Other specified dermatitis: Secondary | ICD-10-CM | POA: Diagnosis not present

## 2024-03-16 DIAGNOSIS — R21 Rash and other nonspecific skin eruption: Secondary | ICD-10-CM

## 2024-03-16 DIAGNOSIS — I480 Paroxysmal atrial fibrillation: Secondary | ICD-10-CM

## 2024-03-16 MED ORDER — LEVOCETIRIZINE DIHYDROCHLORIDE 5 MG PO TABS: 5.0000 mg | ORAL_TABLET | Freq: Every evening | ORAL | 3 refills | Status: AC

## 2024-03-16 MED ORDER — ESOMEPRAZOLE MAGNESIUM 40 MG PO CPDR: 40.0000 mg | DELAYED_RELEASE_CAPSULE | Freq: Every day | ORAL | 3 refills | Status: AC

## 2024-03-16 MED ORDER — TRIAMCINOLONE ACETONIDE 0.5 % EX OINT: 1.0000 | TOPICAL_OINTMENT | Freq: Two times a day (BID) | CUTANEOUS | 3 refills | Status: AC

## 2024-03-16 NOTE — Progress Notes (Signed)
 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  No follow-ups on file.        Subjective:   Encounter date: 03/16/2024  Mark Wood is a 79 y.o. 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   He  has a past medical history of Arthritis, COPD (chronic obstructive pulmonary disease) (HCC), Diabetes mellitus without complication (HCC), Fibromyalgia, and Venous insufficiency.SABRA   He presents with chief complaint of Follow-up (Patient states rash on inner right leg, and back of neck. Sometimes a dry cough and hiccups. ) .   Discussed the use of AI scribe software for clinical note transcription with the patient, who gave verbal consent to proceed.  History of Present Illness           Type 2 Diabetes Mellitus - Managed on Ozempic 1 mg weekly, Metformin 500 mg BID, and insulin glargine-yfgn 33 units daily. - Previously on Jardiance but discontinued due to side effects.  - Last hgbA1C was 10.3% in April of 2025. - Associated diabetic neuropathy controlled with Methadone 10 mg as needed.   Hypertension - Currently on lisinopril  10 mg and Lasix  80 mg daily,  which also aids in managing leg swelling due to peripheral arterial diseease - Last blood pressure in July 2025 was elevated at 135/56 mmHg.   Blood pressure is well controlled at 138/60 mmHg. Diastolic pressure is slightly low but overall stable.  - Continue lisinopril  10 mg daily - Continue Lasix  80 mg daily - Monitor blood pressure regularly    Atrial Fibrillation - On Eliquis  5 mg BID for atrial fibrillation.     Anxiety - Managed with hydroxyzine  10 mg as needed, up to two to three times daily - Has tried: duloxetine or buspirone. - PHQ9 in August 2025 was 0. - In January 2024, his GAD7 was 73.     Gastroesophageal Reflux Disease (GERD) - Managed with omeprazole  20 mg daily.    ROS  Past Surgical History:  Procedure Laterality Date   HEMORROIDECTOMY     LEG SURGERY Right     Medications Ordered Prior to Encounter[1]  Family History  Problem Relation Age of Onset   CAD Father     Social History   Socioeconomic History   Marital status: Married    Spouse name: Not on file   Number of children: Not on file   Years of education: Not on file   Highest education level: Not on file  Occupational History   Not on file  Tobacco Use   Smoking status: Former    Current packs/day: 0.00    Types: Cigarettes    Quit date: 10/07/2006    Years since quitting: 17.4    Passive exposure: Never   Smokeless tobacco:  Never  Vaping Use   Vaping status: Never Used  Substance and Sexual Activity   Alcohol use: No    Alcohol/week: 0.0 standard drinks of alcohol   Drug use: Never   Sexual activity: Not on file  Other Topics Concern   Not on file  Social History Narrative   Not on file   Social Drivers of Health   Tobacco Use: Medium Risk (03/16/2024)   Patient History    Smoking Tobacco Use: Former    Smokeless Tobacco Use: Never    Passive Exposure: Never  Physicist, Medical Strain: Low Risk (10/10/2023)   Overall Financial Resource Strain (CARDIA)    Difficulty of  Paying Living Expenses: Not hard at all  Food Insecurity: No Food Insecurity (10/10/2023)   Epic    Worried About Radiation Protection Practitioner of Food in the Last Year: Never true    Ran Out of Food in the Last Year: Never true  Transportation Needs: No Transportation Needs (10/10/2023)   Epic    Lack of Transportation (Medical): No    Lack of Transportation (Non-Medical): No  Physical Activity: Sufficiently Active (10/10/2023)   Exercise Vital Sign    Days of Exercise per Week: 7 days    Minutes of Exercise per Session: 60 min  Stress: No Stress Concern Present (10/10/2023)   [employer] of Occupational Health - Occupational Stress Questionnaire    Feeling of Stress: Not at all  Social Connections: Moderately Isolated (10/10/2023)   Social Connection and Isolation Panel    Frequency of Communication with Friends and Family: More than three times a week    Frequency of Social Gatherings with Friends and Family: Three times a week    Attends Religious Services: Never    Active Member of Clubs or Organizations: No    Attends Banker Meetings: Never    Marital Status: Married  Catering Manager Violence: Not At Risk (10/10/2023)   Epic    Fear of Current or Ex-Partner: No    Emotionally Abused: No    Physically Abused: No    Sexually Abused: No  Depression (PHQ2-9): Low Risk (10/10/2023)   Depression (PHQ2-9)    PHQ-2 Score: 0  Recent Concern: Depression (PHQ2-9) - Medium Risk (09/14/2023)   Depression (PHQ2-9)    PHQ-2 Score: 9  Alcohol Screen: Low Risk (10/10/2023)   Alcohol Screen    Last Alcohol Screening Score (AUDIT): 0  Housing: Unknown (10/10/2023)   Epic    Unable to Pay for Housing in the Last Year: No    Number of Times Moved in the Last Year: Not on file    Homeless in the Last Year: No  Utilities: Not At Risk (10/10/2023)   Epic    Threatened with loss of utilities: No  Health Literacy: Adequate Health Literacy (10/10/2023)   B1300 Health Literacy    Frequency of need for help  with medical instructions: Never                                                                                                  Objective:  Physical Exam:  BP 102/64   Pulse (!) 56   Temp 97.9 F (36.6 C) (Oral)   Ht 5' 10 (1.778 m)   Wt 219 lb (99.3 kg)   SpO2 96%   BMI 31.42 kg/m    Physical Exam           Physical Exam  No results found.  No results found for this or any previous visit (from the past 2160 hours).      Beverley KATHEE Hummer, MD  I,Emily Lagle,acting as a scribe for Beverley KATHEE Hummer, MD.,have documented all relevant documentation on the behalf of Beverley KATHEE Hummer, MD.  LILLETTE Beverley KATHEE Hummer, MD, have reviewed all documentation for this visit. The documentation on 03/16/2024 for the exam, diagnosis, procedures, and orders are all accurate and complete.     [1]  Current Outpatient Medications on File Prior to Visit  Medication Sig Dispense Refill   ELIQUIS  5 MG TABS tablet TAKE ONE TABLET BY MOUTH TWICE A DAY (CAUTION: BLOOD THINNER) 180 tablet 3   furosemide  (LASIX ) 80 MG tablet Take 1 tablet (80 mg total) by mouth daily. 90 tablet 3   hydrOXYzine  (ATARAX ) 10 MG tablet Take 1 tablet (10 mg total) by mouth 3 (three) times daily as needed for anxiety. 270 tablet 3   insulin glargine-yfgn (SEMGLEE) 100 UNIT/ML Pen Inject 33 Units into the skin daily.     lisinopril  (ZESTRIL ) 10 MG tablet Take 1 tablet (10 mg total) by mouth daily. 90 tablet 3   metFORMIN (GLUCOPHAGE-XR) 500 MG 24 hr tablet Take 500 mg by mouth 2 (two) times daily with a meal.     methadone (DOLOPHINE) 10 MG tablet Take 20 mg by mouth every 8 (eight) hours as needed for moderate pain.      omeprazole  (PRILOSEC) 20 MG capsule Take 1 capsule (20 mg total) by mouth daily. 30 capsule 3   potassium chloride  SA (KLOR-CON  M) 20 MEQ tablet Take 1 tablet (20 mEq total) by mouth daily. 90 tablet 3   rosuvastatin  (CRESTOR ) 10 MG tablet Take 1 tablet (10 mg total) by mouth daily. 90 tablet 3    Semaglutide, 1 MG/DOSE, 4 MG/3ML SOPN Inject 1 mg into the skin once a week.     No current facility-administered medications on file prior to visit.   "

## 2024-03-18 DIAGNOSIS — L308 Other specified dermatitis: Secondary | ICD-10-CM | POA: Insufficient documentation

## 2024-04-18 ENCOUNTER — Ambulatory Visit: Admitting: Family Medicine

## 2024-10-12 ENCOUNTER — Ambulatory Visit
